# Patient Record
Sex: Male | Born: 1950
Health system: Southern US, Community
[De-identification: ages and names within clinical notes are randomized; demographics above are authoritative.]

## PROBLEM LIST (undated history)

## (undated) DIAGNOSIS — S92309A Fracture of unspecified metatarsal bone(s), unspecified foot, initial encounter for closed fracture: Secondary | ICD-10-CM

## (undated) DIAGNOSIS — E785 Hyperlipidemia, unspecified: Secondary | ICD-10-CM

## (undated) DIAGNOSIS — T7840XA Allergy, unspecified, initial encounter: Secondary | ICD-10-CM

## (undated) DIAGNOSIS — K529 Noninfective gastroenteritis and colitis, unspecified: Secondary | ICD-10-CM

## (undated) DIAGNOSIS — L309 Dermatitis, unspecified: Secondary | ICD-10-CM

## (undated) DIAGNOSIS — M1990 Unspecified osteoarthritis, unspecified site: Secondary | ICD-10-CM

## (undated) DIAGNOSIS — K5790 Diverticulosis of intestine, part unspecified, without perforation or abscess without bleeding: Secondary | ICD-10-CM

## (undated) HISTORY — DX: Diverticulosis of intestine, part unspecified, without perforation or abscess without bleeding: K57.90

## (undated) HISTORY — PX: NOSE SURGERY: SHX723

## (undated) HISTORY — DX: Dermatitis, unspecified: L30.9

## (undated) HISTORY — DX: Unspecified osteoarthritis, unspecified site: M19.90

## (undated) HISTORY — DX: Noninfective gastroenteritis and colitis, unspecified: K52.9

## (undated) HISTORY — PX: NASAL SINUS SURGERY: SHX719

## (undated) HISTORY — DX: Hyperlipidemia, unspecified: E78.5

## (undated) HISTORY — PX: INGUINAL HERNIA REPAIR: SUR1180

## (undated) HISTORY — DX: Allergy, unspecified, initial encounter: T78.40XA

---

## 1988-06-06 ENCOUNTER — Encounter (INDEPENDENT_AMBULATORY_CARE_PROVIDER_SITE_OTHER): Payer: Self-pay | Admitting: *Deleted

## 2000-02-01 ENCOUNTER — Encounter: Admission: RE | Admit: 2000-02-01 | Discharge: 2000-02-01 | Payer: Self-pay | Admitting: Sports Medicine

## 2002-08-07 ENCOUNTER — Encounter: Payer: Self-pay | Admitting: Internal Medicine

## 2002-08-07 HISTORY — PX: COLONOSCOPY: SHX174

## 2004-09-11 ENCOUNTER — Ambulatory Visit: Payer: Self-pay | Admitting: Internal Medicine

## 2004-11-06 ENCOUNTER — Ambulatory Visit: Payer: Self-pay | Admitting: Internal Medicine

## 2004-11-11 ENCOUNTER — Ambulatory Visit: Payer: Self-pay | Admitting: Internal Medicine

## 2004-11-25 ENCOUNTER — Ambulatory Visit: Payer: Self-pay | Admitting: Internal Medicine

## 2005-01-04 ENCOUNTER — Ambulatory Visit: Payer: Self-pay | Admitting: Internal Medicine

## 2005-02-05 ENCOUNTER — Ambulatory Visit: Payer: Self-pay | Admitting: Internal Medicine

## 2005-08-24 ENCOUNTER — Ambulatory Visit: Payer: Self-pay | Admitting: Sports Medicine

## 2006-01-26 ENCOUNTER — Ambulatory Visit: Payer: Self-pay | Admitting: Internal Medicine

## 2006-06-01 ENCOUNTER — Ambulatory Visit (HOSPITAL_COMMUNITY): Admission: RE | Admit: 2006-06-01 | Discharge: 2006-06-01 | Payer: Self-pay | Admitting: Sports Medicine

## 2006-06-01 ENCOUNTER — Encounter (INDEPENDENT_AMBULATORY_CARE_PROVIDER_SITE_OTHER): Payer: Self-pay | Admitting: Sports Medicine

## 2006-07-12 ENCOUNTER — Ambulatory Visit: Payer: Self-pay | Admitting: Internal Medicine

## 2006-07-12 LAB — CONVERTED CEMR LAB
ALT: 23 units/L (ref 0–40)
Albumin: 3.9 g/dL (ref 3.5–5.2)
Alkaline Phosphatase: 52 units/L (ref 39–117)
Cholesterol: 172 mg/dL (ref 0–200)
LDL Cholesterol: 111 mg/dL — ABNORMAL HIGH (ref 0–99)
Total Bilirubin: 2 mg/dL — ABNORMAL HIGH (ref 0.3–1.2)
Total Protein: 6.9 g/dL (ref 6.0–8.3)

## 2006-07-25 ENCOUNTER — Ambulatory Visit: Payer: Self-pay | Admitting: Internal Medicine

## 2006-09-28 ENCOUNTER — Ambulatory Visit: Payer: Self-pay | Admitting: Family Medicine

## 2006-09-28 ENCOUNTER — Telehealth: Payer: Self-pay | Admitting: Internal Medicine

## 2006-10-11 ENCOUNTER — Ambulatory Visit: Payer: Self-pay | Admitting: Internal Medicine

## 2006-10-11 LAB — CONVERTED CEMR LAB
Basophils Absolute: 0 10*3/uL (ref 0.0–0.1)
Eosinophils Absolute: 0.1 10*3/uL (ref 0.0–0.6)
Hemoglobin: 14.9 g/dL (ref 13.0–17.0)
MCHC: 34.3 g/dL (ref 30.0–36.0)
MCV: 90.4 fL (ref 78.0–100.0)
Monocytes Absolute: 0.6 10*3/uL (ref 0.2–0.7)
Monocytes Relative: 11.4 % — ABNORMAL HIGH (ref 3.0–11.0)
RDW: 12 % (ref 11.5–14.6)
T4, Total: 6 ug/dL (ref 5.0–12.5)
TSH: 1.32 microintl units/mL (ref 0.35–5.50)

## 2006-10-12 DIAGNOSIS — J309 Allergic rhinitis, unspecified: Secondary | ICD-10-CM | POA: Insufficient documentation

## 2006-10-12 DIAGNOSIS — G4700 Insomnia, unspecified: Secondary | ICD-10-CM | POA: Insufficient documentation

## 2006-12-18 DIAGNOSIS — J019 Acute sinusitis, unspecified: Secondary | ICD-10-CM | POA: Insufficient documentation

## 2006-12-21 ENCOUNTER — Telehealth: Payer: Self-pay | Admitting: Internal Medicine

## 2006-12-22 ENCOUNTER — Ambulatory Visit: Payer: Self-pay | Admitting: Internal Medicine

## 2007-01-17 ENCOUNTER — Telehealth (INDEPENDENT_AMBULATORY_CARE_PROVIDER_SITE_OTHER): Payer: Self-pay | Admitting: *Deleted

## 2007-01-20 ENCOUNTER — Telehealth (INDEPENDENT_AMBULATORY_CARE_PROVIDER_SITE_OTHER): Payer: Self-pay | Admitting: *Deleted

## 2007-01-31 ENCOUNTER — Ambulatory Visit: Payer: Self-pay | Admitting: Internal Medicine

## 2007-01-31 LAB — CONVERTED CEMR LAB
ALT: 25 units/L (ref 0–53)
AST: 20 units/L (ref 0–37)
Basophils Relative: 0.5 % (ref 0.0–1.0)
Bilirubin, Direct: 0.3 mg/dL (ref 0.0–0.3)
CO2: 31 meq/L (ref 19–32)
Calcium: 9.2 mg/dL (ref 8.4–10.5)
Chloride: 101 meq/L (ref 96–112)
Creatinine, Ser: 1 mg/dL (ref 0.4–1.5)
Eosinophils Relative: 5.6 % — ABNORMAL HIGH (ref 0.0–5.0)
GFR calc non Af Amer: 82 mL/min
Glucose, Bld: 78 mg/dL (ref 70–99)
HCT: 43.4 % (ref 39.0–52.0)
LDL Cholesterol: 115 mg/dL — ABNORMAL HIGH (ref 0–99)
MCV: 91.6 fL (ref 78.0–100.0)
Neutrophils Relative %: 46.7 % (ref 43.0–77.0)
PSA: 0.68 ng/mL (ref 0.10–4.00)
Platelets: 223 10*3/uL (ref 150–400)
RBC: 4.74 M/uL (ref 4.22–5.81)
RDW: 12.3 % (ref 11.5–14.6)
Sodium: 139 meq/L (ref 135–145)
Total Bilirubin: 2.2 mg/dL — ABNORMAL HIGH (ref 0.3–1.2)
Total CHOL/HDL Ratio: 3.6
Triglycerides: 67 mg/dL (ref 0–149)
VLDL: 13 mg/dL (ref 0–40)
WBC: 5.2 10*3/uL (ref 4.5–10.5)

## 2007-02-08 ENCOUNTER — Ambulatory Visit: Payer: Self-pay | Admitting: Internal Medicine

## 2007-02-08 DIAGNOSIS — J329 Chronic sinusitis, unspecified: Secondary | ICD-10-CM | POA: Insufficient documentation

## 2007-02-08 DIAGNOSIS — E785 Hyperlipidemia, unspecified: Secondary | ICD-10-CM | POA: Insufficient documentation

## 2007-05-09 ENCOUNTER — Ambulatory Visit: Payer: Self-pay | Admitting: Sports Medicine

## 2007-05-09 DIAGNOSIS — M25529 Pain in unspecified elbow: Secondary | ICD-10-CM | POA: Insufficient documentation

## 2007-05-09 DIAGNOSIS — S838X9A Sprain of other specified parts of unspecified knee, initial encounter: Secondary | ICD-10-CM | POA: Insufficient documentation

## 2007-05-09 DIAGNOSIS — Q667 Congenital pes cavus, unspecified foot: Secondary | ICD-10-CM | POA: Insufficient documentation

## 2007-05-09 DIAGNOSIS — S86819A Strain of other muscle(s) and tendon(s) at lower leg level, unspecified leg, initial encounter: Secondary | ICD-10-CM

## 2007-05-15 ENCOUNTER — Telehealth: Payer: Self-pay | Admitting: Internal Medicine

## 2007-05-18 ENCOUNTER — Telehealth: Payer: Self-pay | Admitting: Internal Medicine

## 2007-05-30 ENCOUNTER — Ambulatory Visit: Payer: Self-pay | Admitting: Sports Medicine

## 2007-05-30 DIAGNOSIS — M216X9 Other acquired deformities of unspecified foot: Secondary | ICD-10-CM | POA: Insufficient documentation

## 2007-06-09 ENCOUNTER — Ambulatory Visit: Payer: Self-pay | Admitting: Internal Medicine

## 2007-09-01 ENCOUNTER — Telehealth (INDEPENDENT_AMBULATORY_CARE_PROVIDER_SITE_OTHER): Payer: Self-pay | Admitting: *Deleted

## 2007-09-26 ENCOUNTER — Telehealth: Payer: Self-pay | Admitting: Internal Medicine

## 2007-09-26 ENCOUNTER — Ambulatory Visit: Payer: Self-pay | Admitting: Internal Medicine

## 2007-09-26 DIAGNOSIS — N410 Acute prostatitis: Secondary | ICD-10-CM | POA: Insufficient documentation

## 2007-09-26 DIAGNOSIS — R361 Hematospermia: Secondary | ICD-10-CM | POA: Insufficient documentation

## 2007-10-13 ENCOUNTER — Telehealth: Payer: Self-pay | Admitting: *Deleted

## 2007-12-06 ENCOUNTER — Telehealth (INDEPENDENT_AMBULATORY_CARE_PROVIDER_SITE_OTHER): Payer: Self-pay | Admitting: *Deleted

## 2007-12-06 ENCOUNTER — Telehealth: Payer: Self-pay | Admitting: Internal Medicine

## 2008-01-09 ENCOUNTER — Telehealth: Payer: Self-pay | Admitting: Internal Medicine

## 2008-03-21 ENCOUNTER — Ambulatory Visit: Payer: Self-pay | Admitting: Family Medicine

## 2008-03-21 DIAGNOSIS — B070 Plantar wart: Secondary | ICD-10-CM | POA: Insufficient documentation

## 2008-05-01 ENCOUNTER — Telehealth: Payer: Self-pay | Admitting: Internal Medicine

## 2008-05-08 ENCOUNTER — Ambulatory Visit: Payer: Self-pay | Admitting: Internal Medicine

## 2008-05-08 DIAGNOSIS — M542 Cervicalgia: Secondary | ICD-10-CM | POA: Insufficient documentation

## 2008-05-09 ENCOUNTER — Ambulatory Visit: Payer: Self-pay | Admitting: Internal Medicine

## 2008-05-13 ENCOUNTER — Telehealth: Payer: Self-pay | Admitting: Internal Medicine

## 2008-05-16 ENCOUNTER — Telehealth: Payer: Self-pay | Admitting: Internal Medicine

## 2008-08-14 ENCOUNTER — Encounter: Payer: Self-pay | Admitting: Internal Medicine

## 2008-09-02 ENCOUNTER — Telehealth (INDEPENDENT_AMBULATORY_CARE_PROVIDER_SITE_OTHER): Payer: Self-pay | Admitting: *Deleted

## 2008-09-06 ENCOUNTER — Ambulatory Visit: Payer: Self-pay | Admitting: Internal Medicine

## 2008-11-26 ENCOUNTER — Ambulatory Visit: Payer: Self-pay | Admitting: Sports Medicine

## 2008-11-26 DIAGNOSIS — M79609 Pain in unspecified limb: Secondary | ICD-10-CM | POA: Insufficient documentation

## 2008-11-26 DIAGNOSIS — M479 Spondylosis, unspecified: Secondary | ICD-10-CM | POA: Insufficient documentation

## 2009-01-31 ENCOUNTER — Ambulatory Visit: Payer: Self-pay | Admitting: Family Medicine

## 2009-01-31 DIAGNOSIS — J209 Acute bronchitis, unspecified: Secondary | ICD-10-CM | POA: Insufficient documentation

## 2009-03-17 ENCOUNTER — Ambulatory Visit: Payer: Self-pay | Admitting: Internal Medicine

## 2009-03-24 ENCOUNTER — Ambulatory Visit: Payer: Self-pay | Admitting: Internal Medicine

## 2009-05-15 ENCOUNTER — Ambulatory Visit: Payer: Self-pay | Admitting: Sports Medicine

## 2009-05-15 DIAGNOSIS — M542 Cervicalgia: Secondary | ICD-10-CM | POA: Insufficient documentation

## 2009-08-07 ENCOUNTER — Ambulatory Visit: Payer: Self-pay | Admitting: Sports Medicine

## 2009-08-07 DIAGNOSIS — M25519 Pain in unspecified shoulder: Secondary | ICD-10-CM | POA: Insufficient documentation

## 2009-08-15 ENCOUNTER — Encounter: Payer: Self-pay | Admitting: Sports Medicine

## 2009-08-28 ENCOUNTER — Ambulatory Visit: Payer: Self-pay | Admitting: Sports Medicine

## 2009-08-29 ENCOUNTER — Encounter: Payer: Self-pay | Admitting: Sports Medicine

## 2009-10-27 ENCOUNTER — Encounter: Payer: Self-pay | Admitting: Internal Medicine

## 2009-12-02 ENCOUNTER — Ambulatory Visit: Payer: Self-pay | Admitting: Sports Medicine

## 2009-12-10 ENCOUNTER — Ambulatory Visit: Payer: Self-pay | Admitting: Internal Medicine

## 2009-12-10 DIAGNOSIS — K5289 Other specified noninfective gastroenteritis and colitis: Secondary | ICD-10-CM | POA: Insufficient documentation

## 2009-12-10 DIAGNOSIS — R197 Diarrhea, unspecified: Secondary | ICD-10-CM | POA: Insufficient documentation

## 2009-12-17 ENCOUNTER — Encounter: Payer: Self-pay | Admitting: Sports Medicine

## 2009-12-23 ENCOUNTER — Encounter: Payer: Self-pay | Admitting: Sports Medicine

## 2010-02-16 ENCOUNTER — Ambulatory Visit: Payer: Self-pay | Admitting: Internal Medicine

## 2010-04-01 ENCOUNTER — Ambulatory Visit: Admit: 2010-04-01 | Payer: Self-pay | Admitting: Internal Medicine

## 2010-04-02 ENCOUNTER — Encounter: Payer: Self-pay | Admitting: Internal Medicine

## 2010-04-29 ENCOUNTER — Ambulatory Visit: Payer: Self-pay | Admitting: Internal Medicine

## 2010-04-29 ENCOUNTER — Ambulatory Visit: Admit: 2010-04-29 | Payer: Self-pay | Admitting: Internal Medicine

## 2010-04-30 NOTE — Procedures (Signed)
Summary: [facility]   Imported By: Edmonia James 02/23/2010 12:09:56  _____________________________________________________________________  External Attachment:    Type:   Image     Comment:   External Document

## 2010-04-30 NOTE — Consult Note (Signed)
Summary: [employer]  [employer].   Imported By: Tobin Chad 08/15/2009 09:02:45  _____________________________________________________________________  External Attachment:    Type:   Image     Comment:   External Document

## 2010-04-30 NOTE — Letter (Signed)
Summary: New Patient letter  [facility]  [address] [place], [place] [address]   Phone: [phone]  Fax: [phone]       10/27/2009 MRN: [id number]  [place], [place]  [address]  Dear [place],  Welcome to the Gastroenterology Division at [facility].    You are scheduled to see Dr.  Henrene Pastor on 12-10-09 at 10am on the 3rd floor at [facility], [place] [employer].  We ask that you try to arrive at our office 15 minutes prior to your appointment time to allow for check-in.  We would like you to complete the enclosed self-administered evaluation form prior to your visit and bring it with you on the day of your appointment.  We will review it with you.  Also, please bring a complete list of all your medications or, if you prefer, bring the medication bottles and we will list them.  Please bring your insurance card so that we may make a copy of it.  If your insurance requires a referral to see a specialist, please bring your referral form from your primary care physician.  Co-payments are due at the time of your visit and may be paid by cash, check or credit card.     Your office visit will consist of a consult with your physician (includes a physical exam), any laboratory testing he/she may order, scheduling of any necessary diagnostic testing (e.g. x-ray, ultrasound, CT-scan), and scheduling of a procedure (e.g. Endoscopy, Colonoscopy) if required.  Please allow enough time on your schedule to allow for any/all of these possibilities.    If you cannot keep your appointment, please call [phone] to cancel or reschedule prior to your appointment date.  This allows [place] the opportunity to schedule an appointment for another patient in need of care.  If you do not cancel or reschedule by 5 p.m. the business day prior to your appointment date, you will be charged a $50.00 late cancellation/no-show fee.    Thank you for choosing [facility] for your medical needs.  We appreciate the opportunity to care for you.  Please visit [place] at our website  to learn more about our practice.                     Sincerely,                                                             The Gastroenterology Division

## 2010-04-30 NOTE — Assessment & Plan Note (Signed)
Summary: FOLLOW UP - lymphocytic colitis   History of Present Illness Visit Type: Follow-up Visit Primary GI MD: Scarlette Shorts MD Primary Shakeel Disney: Benay Pillow, MD Requesting Tyus Kallam: na Chief Complaint: Lymphocytic Colitis-Pt is doing better.  He is still having diarrhea, but he is only having 6-8 bowel movements per day.  BM's are a mix of loose-watery stool and formed stool.  Pt is still haivng bloating, but not to the degree from 6 weeks ago. History of Present Illness:   60 year old white male with hyperlipidemia and biopsy-proven lymphocytic colitis. He was last seen December 10, 2009. At that time budesonide 9 mg daily was initiated. He returns for followup. She tells me that he has had significant improvement in his bowel habits. He continues to use the bathroom up to 8 times daily. However, his stools are now formed. No, pain or urgency. No appreciable medication side effects. He is pleased. He would prefer even greater improvement as manifested by less frequent bowels.   GI Review of Systems    Reports bloating.      Denies abdominal pain, acid reflux, belching, chest pain, dysphagia with liquids, dysphagia with solids, heartburn, loss of appetite, nausea, vomiting, vomiting blood, weight loss, and  weight gain.      Reports diarrhea.     Denies anal fissure, black tarry stools, change in bowel habit, constipation, diverticulosis, fecal incontinence, heme positive stool, hemorrhoids, irritable bowel syndrome, jaundice, light color stool, liver problems, rectal bleeding, and  rectal pain. Preventive Screening-Counseling & Management  Caffeine-Diet-Exercise     Does Patient Exercise: yes    Current Medications (verified): 1)  Lipitor 10 Mg Tabs (Atorvastatin Calcium) .Marland Kitchen.. 1 By Mouth Daily 2)  Flonase 50 Mcg/act Susp (Fluticasone Propionate) .... 2 Spary Each Nostril Qd 3)  Ketoprofen Gel 20% .... Use Qid As Directed 4)  Budesonide 3 Mg Xr24h-Cap (Budesonide) .... Take 3 By Mouth  Once Daily  Allergies (verified): No Known Drug Allergies  Past History:  Past Medical History: Reviewed history from 12/10/2009 and no changes required. Allergic rhinitis Hyperlipidemia Diverticulosis GERD Arthritis  Past Surgical History: Reviewed history from 12/10/2009 and no changes required. Colonoscopy-08/07/2002 Hernia Surgery  Social History: Occupation: Psychologist Married Childern Patient has never smoked.  Alcohol Use - yes: Occ Daily Caffeine Use: 1 daily  Illicit Drug Use - no Patient gets regular exercise. 6 days/week  Review of Systems       The patient complains of allergy/sinus, back pain, hearing problems, sleeping problems, and thirst - excessive.  The patient denies anemia, anxiety-new, arthritis/joint pain, blood in urine, breast changes/lumps, confusion, cough, coughing up blood, depression-new, fainting, fatigue, fever, headaches-new, heart murmur, heart rhythm changes, itching, muscle pains/cramps, night sweats, nosebleeds, shortness of breath, skin rash, sore throat, swelling of feet/legs, swollen lymph glands, urination - excessive, urination changes/pain, urine leakage, vision changes, and voice change.    Vital Signs:  Patient profile:   60 year old male Height:      73 inches Weight:      154 pounds BMI:     20.39 Pulse rate:   72 / minute Pulse rhythm:   regular BP sitting:   140 / 76  (left arm) Cuff size:   regular  Vitals Entered By: Abelino Derrick CMA Deborra Medina) (February 16, 2010 1:59 PM)  Physical Exam  General:  Well developed, well nourished, no acute distress. Head:  Normocephalic and atraumatic. Eyes:  PERRLA, no icterus. Lungs:  Clear throughout to auscultation. Heart:  Regular rate and rhythm;  no murmurs, rubs,  or bruits. Abdomen:  Soft, nontender and nondistended. No masses, hepatosplenomegaly or hernias noted. Normal bowel sounds. Pulses:  Normal pulses noted. Extremities:  no edema Neurologic:  alert and  oriented Skin:  no jaundice Psych:  Alert and cooperative. Normal mood and affect.   Impression & Recommendations:  Problem # 1:  COLITIS (ICD-558.9) lymphocytic colitis. Improved, though incompletely, on budesonide  Plan: #1. Continue budesonide 9 mg daily #2. Prescribed Lomotil p.r.n. #3. Routine office followup in 6 weeks  Patient Instructions: 1)  Lomotil #60 x 2 RFs. printed and faxed to pharmacy 2)  Please schedule a follow-up appointment in 6 weeks.  3)  Copy sent to : Benay Pillow, MD 4)  The medication list was reviewed and reconciled.  All changed / newly prescribed medications were explained.  A complete medication list was provided to the patient / caregiver. Prescriptions: LOMOTIL 2.5-0.025 MG TABS (DIPHENOXYLATE-ATROPINE) 1 by mouth every 4-6 hours as needed diarrhea  #60 x 2   Entered by:   Randye Lobo NCMA   Authorized by:   Irene Shipper MD   Signed by:   Randye Lobo NCMA on 02/16/2010   Method used:   Printed then [facility]ed [facility] ...       [facility]  [address] [phone]       [address]       [place], [place]  [address]       Ph: [phone] or [phone]       Fax: [phone]   RxID:   [id number]

## 2010-04-30 NOTE — Letter (Signed)
Summary: No Show/Missed Appointment  [facility]  [place], [place] [address]   Phone: [phone]  Fax: [phone]    04/02/2010  [place]  [place], [place]  [address]   Dear [name]:  We had an appointment reserved for you Wednesday, 04/01/10, and we were sorry not to see you.  Since the doctor felt it was important to see you, please call our office as soon as it is convenient so we may reschedule your appointment.  Sincerely,   [facility]

## 2010-04-30 NOTE — Assessment & Plan Note (Signed)
Summary: DIARRHEA - history lymphocytic colitis    History of Present Illness Visit Type: new patient  Primary GI MD: Scarlette Shorts MD Primary Provider: Ricard Dillon, MD  Requesting Provider: na Chief Complaint: lower abd pain, bloating, diarrhea, and diverticulosis History of Present Illness:   60 year old white male with hyperlipidemia and biopsy-proven lymphocytic colitis. Since there regarding chronic problems with diarrhea and wishes to discuss medical therapies. He developed problems with diarrhea around 2001. He underwent colonoscopy in 2004 for routine cancer screening. At that time he reported chronic diarrhea. Colonoscopy revealed sigmoid diverticulosis. No neoplasia. Random colon biopsies confirm lymphocytic colitis. Serologic testing for celiac disease was negative. This is preference in 2004 and again in 2006 to use Imodium for his diarrhea. We discussed other therapies. He was given a prescription for Lomotil and Questran which he did not fill. We also discussed budesonide. He continued with daily diarrhea. Proximal to 6 loose bowel movements per day. Never has a day without loose bowels. Patient awoken to defecate. Interestingly, does report that his bowels improved when he takes a course of steroids for sinus and allergies. No bleeding or other issues. He interested in some medical therapy that might treat the condition rather than the symptom.   GI Review of Systems    Reports abdominal pain and  bloating.     Location of  Abdominal pain: lower abdominal cramping relieved with defecation.    Denies acid reflux, belching, chest pain, dysphagia with liquids, dysphagia with solids, heartburn, loss of appetite, nausea, vomiting, vomiting blood, weight loss, and  weight gain.      Reports diarrhea and  diverticulosis.     Denies anal fissure, black tarry stools, change in bowel habit, constipation, fecal incontinence, heme positive stool, hemorrhoids, irritable bowel syndrome,  jaundice, light color stool, liver problems, rectal bleeding, and  rectal pain.    Current Medications (verified): 1)  Lipitor 10 Mg Tabs (Atorvastatin Calcium) .Marland Kitchen.. 1 By Mouth Daily 2)  Nasonex 50 Mcg/act  Susp (Mometasone Furoate) .... Two Spray Q Nare Bid 3)  Gabapentin 300 Mg Caps (Gabapentin) .Marland Kitchen.. 1 By Mouth Tid 4)  Ketoprofen Gel 20% .... Use Qid As Directed 5)  Anti-Diarrheal 2 Mg Tabs (Loperamide Hcl) .... 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  Alcohol Use - yes: Occ Daily Caffeine Use: 1 daily  Illicit Drug Use - no  Review of Systems       The patient complains of allergy/sinus, arthritis/joint pain, back pain, hearing problems, and sleeping problems.  The patient denies anemia, anxiety-new, blood in urine, breast changes/lumps, change in vision, confusion, cough, coughing up blood, depression-new, fainting, fatigue, fever, headaches-new, heart murmur, heart rhythm changes, itching, muscle pains/cramps, night sweats, nosebleeds, shortness of breath, skin rash, sore throat, swelling of feet/legs, swollen lymph glands, thirst - excessive, urination - excessive, urination changes/pain, urine leakage, vision changes, and voice change.    Vital Signs:  Patient profile:   60 year old male Height:      73 inches Weight:      154 pounds BMI:     20.39 BSA:     1.93 Pulse rate:  60 / minute Pulse rhythm:   regular BP sitting:   110 / 64  (left arm) Cuff size:   regular  Vitals Entered By: Hope Pigeon CMA (December 10, 2009 10:29 AM)  Physical  Exam  General:  Well developed, well nourished, no acute distress. Head:  Normocephalic and atraumatic. Eyes:  PERRLA, no icterus. Ears:  Normal auditory acuity. Nose:  No deformity, discharge,  or lesions. Mouth:  No deformity or lesions, dentition normal. Neck:  Supple; no masses or thyromegaly. Lungs:  Clear throughout to auscultation. Heart:  Regular rate and rhythm; no murmurs, rubs,  or bruits. Abdomen:  Soft, nontender and nondistended. No masses, hepatosplenomegaly or hernias noted. Normal bowel sounds. Msk:  Symmetrical with no gross deformities. Normal posture. Pulses:  Normal pulses noted. Extremities:  No clubbing, cyanosis, edema or deformities noted. Neurologic:  Alert and  oriented x4;  grossly normal neurologically. Skin:  Intact without significant lesions or rashes. Psych:  Alert and cooperative. Normal mood and affect.   Impression & Recommendations:  Problem # 1:  COLITIS (ICD-558.9) Chronic diarrhea 10 years duration. Biopsy-proven lymphocytic colitis in 2004. Managed to this point with p.r.n. Imodium. Significant improvement the patient takes short courses of prednisone for other reasons. Relapse off prednisone. We discussed other medical therapies including budesonide. He is interested in a trial of budesonide therapy.  Plan: #1. Budesonide 9 mg daily #2. Antidiarrheals p.r.n. May be able to wean off if budesonide therapy effective #3. Routine office followup in about 6 weeks  Problem # 2:  SCREENING COLORECTAL-CANCER (ICD-V76.51) negative for neoplasia and 2004. Due for routine followup around 2014  Patient Instructions: 1)  Budesonide 3 mg 3 by mouth once daily #90 x 11 RFs. 2)  Please schedule a follow-up appointment in 6 weeks.  3)  Copy sent to : Ricard Dillon, MD  4)  The medication list was reviewed and reconciled.  All changed / newly prescribed medications were explained.  A complete medication list was provided to the patient /  caregiver. Prescriptions: BUDESONIDE 3 MG XR24H-CAP (BUDESONIDE) take 3 by mouth once daily  #90 x 11   Entered by:   Randye Lobo NCMA   Authorized by:   Irene Shipper MD   Signed by:   Randye Lobo NCMA on 12/10/2009   Method used:   [facility]        [facility]  [address] [phone][facility] (retail)       [place], [place]  [address]       Ph: [phone] or [phone]       Fax: [phone]   RxID:   [id number]   Appended Document: change pharmacy    Clinical Lists Changes  Medications: Rx of BUDESONIDE 3 MG XR24H-CAP (BUDESONIDE) take 3 by mouth once daily;  #90 x 11;  Signed;  Entered by: Randye Lobo NCMA;  Authorized by: Irene Shipper MD;  Method used: Electronically to [facility] [place], [place], [place]  [address], Ph: [phone], Fax: [phone]    Prescriptions: BUDESONIDE 3 MG XR24H-CAP (BUDESONIDE) take 3 by mouth once daily  #90 x 11   Entered by:   Randye Lobo NCMA   Authorized by:   Irene Shipper MD   Signed by:   Randye Lobo NCMA on 12/15/2009   Method used:   Electronically to        [facility]* (  retail)       [address], [place]  [address]       Ph: [phone]       Fax: [phone]   RxID:   [id number]

## 2010-04-30 NOTE — Miscellaneous (Signed)
Summary: [facility]  [facility]   Imported By: Tobin Chad 09/03/2009 10:48:21  _____________________________________________________________________  External Attachment:    Type:   Image     Comment:   External Document

## 2010-04-30 NOTE — Assessment & Plan Note (Signed)
Summary: 2:15-F/U,MC   Vital Signs:  Patient profile:   60 year old male BP sitting:   143 / 88  Vitals Entered By: April Manson CMA (August 28, 2009 2:21 PM)  History of Present Illness: Pt presents with new onset of right shoulder pain that has been getting worse for the past 2 weeks. 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 He has the pain directly on top of his right AC joint.The pain is intermittent. Mildy hurts if he sleeps on his right shoulder but does not wake him up from sleep. Currently taking 369m of gabapentin QHS for his neck.  He denies any injuries. Worse with doing freestyle, followed by backstroke and then breast stroke. He told his therapist who did iontophoresis on his shoulder which made it feel better. He was able to return to swimming the following day without pain for the first 600 yards. However, he developed pain but continued swimming. He has developed enough pain that he has not gone swimming since 08/22/2009.   Allergies: No Known Drug Allergies  Physical Exam  General:  alert and well-developed.   Head:  normocephalic and atraumatic.   Ears:  Normal hearing Mouth:  MMM Neck:  supple and full ROM.  No TTP along cervical spine. Lungs:  normal respiratory effort.   Msk:  Right Shoulder: Prominant AC joint which is similar to left AC joint No bruising or edema Full ROM with some pain at 90 degrees with flexion and abduction + TTP over ACraig Hospitaljoint 5/5 strength with resisted internal and external rotation, biceps, triceps and deltoid testing Neg Neer's, Speed's, Obrien's, Yergesson's Mildly positive Hawkin's, + Cross over Neurovascularly intact  Left Shoulder: More prominant scapula  Prominant AC joint Full ROM without pain 5/5 strength with resisted IR, ER, triceps, biceps and deltoid testing Neg special testing No  TTP throughout Neurovascularly intact  Additional Exam:  MSK UKoreathere is inc fluid over both AC joints no sing of DJD but some increased space noted  RC tendons are all intact bursa shows mild swelling superiorly  no impingement on dynamic motion  images saved   Impression & Recommendations:  Problem # 1:  SHOULDER PAIN, RIGHT (ICD-719.41)  This is new and possibly from compensation while working on left  mild bursitiis noted  see instructions and will send for PT  Orders: [place] (([id number]  Problem # 2:  CERVICAL RADICULOPATHY (ICD-723.4) still w winging on left  somewhat less keep working w PT  Complete Medication List: 1)  Lipitor 10 Mg Tabs (Atorvastatin calcium) ..Marland Kitchen. 1 by mouth daily 2)  Nasonex 50 Mcg/act Susp (Mometasone furoate) .... Two spray q nare bid 3)  Ambien 10 Mg Tabs (Zolpidem tartrate) .... One by mouth as needed sleep 4)  Gabapentin 300 Mg Caps (Gabapentin) ..Marland Kitchen. 1 by mouth tid  Patient Instructions: 1)  On UKoreascan there is mild to moderate fluid in subdeltoid bursa on RT 2)  Both AC joints are lax with increase fluid build up 3)  no rotator cuff tears 4)  please continue with PT - twice per week ideally next 6 weeks or so 5)  Iontophoresis may be helpful for Rt shoulder with dexamethasone..Marland Kitchen6)  I would like to reck both shoulders when JJenny Reichmannand You feel that you have made pretty good progress 7)  ice shoulder for 10  minutes once or twice daily over top 8)  for next week only easy swimming at most but get opinion at PT tomorrow to see if shoulder is progressing OK 9)  If so for next week keep swimming to easy workouts for at least 7 to 10 days / longer if symptoms persist  Appended Document: 2:15-F/U,MC Also given a prescription for iontophoresis at PT

## 2010-04-30 NOTE — Procedures (Signed)
Summary: Colonoscopy/[facility]  Colonoscopy/[facility]   Imported By: Edmonia James 02/23/2010 12:07:25  _____________________________________________________________________  External Attachment:    Type:   Image     Comment:   External Document

## 2010-04-30 NOTE — Assessment & Plan Note (Signed)
Summary: NECK/BACK/SHOULDER PAIN,MC   Vital Signs:  Patient profile:   60 year old male Height:      73 inches Weight:      155 pounds BP sitting:   128 / 80  Vitals Entered By: April Manson CMA (May 15, 2009 11:00 AM)  History of Present Illness: January first noticed pain on airplane in upper periscapular area noticed pain. Went to family practice dr in february and had xrays taken of cervical spine.  These showed significant DDD and OA. PT by Katrine Coho really helped and he stopped in may after improvement.   [place] and hurt back in april. Since then the neck is more troublesome Upper back and neck pain with swimming now geting weak with breast stroke and with butterfly these also send pain to C6 to T2 area post spine   Saw him in Aug and was given exercises. PNF to right side of neck was painful-could not do.  He did scap stabilization exercises and gained strength however with travel, more sxs he feels he is now getting worse  Allergies: No Known Drug Allergies  Physical Exam  General:  Well-developed,well-nourished,in no acute distress; alert,appropriate and cooperative throughout examination Neck:  some limitation of full motion noted on ext, flex limited left lat bend and that casues some pain left rotation < RT  no Trap spasm today Msk:  repetitive abduction exercises causes winging of left scapula after only 4 repeats he also has lat migration and IR noted  there is focal area of atrophy in upper medial scapular border on left  shoulder exam is otherwise unremarkable   Impression & Recommendations:  Problem # 1:  NECK PAIN, ACUTE (ICD-723.1) while a lot of the acute pain has resolved he now has some chronic neck pain particularly at night or with certain swim strokes  Problem # 2:  DEGENERATIVE JOINT DISEASE, 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 1 by mouth daily 2)  Nasonex 50 Mcg/act Susp (Mometasone furoate) .... Two spray q nare bid 3)  Ambien 10 Mg Tabs (Zolpidem tartrate) .... One by mouth as needed sleep 4)  Gabapentin 300 Mg Caps (Gabapentin) .Marland Kitchen.. 1 by mouth tid  Patient Instructions: 1)  use 1 gabapentin at night 2)  If no change in 2 weeks go to 2 at night 3)  try PT again with Jenny Reichmann 4)  recommend get onto a home program once this is complete 5)  still would do 3 to 6 weeks of therapy to get this left periscapular area functioning better 6)  reck 6 weeks Prescriptions: GABAPENTIN 300 MG CAPS (GABAPENTIN) 1 by mouth tid  #90 x 2   Entered and Authorized by:   Stefanie Libel MD   Signed by:   Stefanie Libel MD on 05/15/2009   Method used:   Electronically to        [facility] (retail)       [address]       [place], [place]  [address]       Ph: [phone]       Fax: [phone]   RxID:   [id number]

## 2010-04-30 NOTE — Progress Notes (Signed)
Summary: [facility]   Imported By: Edmonia James 02/23/2010 12:05:56  _____________________________________________________________________  External Attachment:    Type:   Image     Comment:   External Document

## 2010-04-30 NOTE — Assessment & Plan Note (Signed)
Summary: f/u,mc   Vital Signs:  Patient profile:   60 year old male BP sitting:   125 / 8  Vitals Entered By: April Manson CMA (December 02, 2009 8:56 AM)  History of Present Illness: Very limited in ability to swim max has been 2000 M usually swims 400 to 800 M 2/2 pain RT side pain is over [facility] joint area  left feels weak  PT felt he had gained strength in areas we rehabbed but not a change in [facility] joint  Allergies: No Known Drug Allergies  Physical Exam  General:  Well-developed,well-nourished,in no acute distress; alert,appropriate and cooperative throughout examination Msk:  neg signs for impingement RT AC joint sits high and stillis painful on xover type moves good strength thru out  scapular motion is now symmetrical localized area of atrophy on left periscapular area is noted does not show protraction of scapula on RT w repetitive movement as before   Impression & Recommendations:  Problem # 1:  SHOULDER PAIN, RIGHT (ICD-719.41) Assessment Unchanged This is opnly marginally improved and seems to be chron ic AC  will add ketorprofen gel and see if this helps sxs  swim on every other day basis and limit yards  keep up HEP  Problem # 2:  SHOULDER PAIN, LEFT (ICD-719.41) Assessment: Improved this is resoved  Problem # 3:  CERVICAL RADICULOPATHY (ICD-723.4) rare sxs  can try weaining neurontin by goin every other day if desired  will reck prn  Complete Medication List: 1)  Lipitor 10 Mg Tabs (Atorvastatin calcium) .Marland Kitchen.. 1 by mouth daily 2)  Nasonex 50 Mcg/act Susp (Mometasone furoate) .... Two spray q nare bid 3)  Ambien 10 Mg Tabs (Zolpidem tartrate) .... One by mouth as needed sleep 4)  Gabapentin 300 Mg Caps (Gabapentin) .Marland Kitchen.. 1 by mouth tid 5)  Ketoprofen Gel 20%  .... Use qid as directed Prescriptions: KETOPROFEN GEL 2% USE QID AS DIRECTED  #[id number] x PRN   Entered by:   April Manson CMA   Authorized by:   Stefanie Libel MD   Signed by:   April Manson CMA on 12/02/2009   Method used:   Print then Give to Patient   RxID:   [id number]

## 2010-04-30 NOTE — Letter (Signed)
Summary: [facility] PT prescription  [facility] prescription   Imported By: Tobin Chad 08/28/2009 16:48:52  _____________________________________________________________________  External Attachment:    Type:   Image     Comment:   External Document

## 2010-04-30 NOTE — Miscellaneous (Signed)
Summary: [facility]   Imported By: Tobin Chad 12/23/2009 15:19:23  _____________________________________________________________________  External Attachment:    Type:   Image     Comment:   External Document

## 2010-04-30 NOTE — Consult Note (Signed)
Summary: [facility]   Imported By: Tobin Chad 12/25/2009 10:40:35  _____________________________________________________________________  External Attachment:    Type:   Image     Comment:   External Document

## 2010-04-30 NOTE — Assessment & Plan Note (Signed)
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 specific strength testing of Rotator cuff mm reveals good strength throughout; no signs of impingement; speeds and yergason's tests normal;  no labral pathology noted;  left scapula is IR and wings at rest with repeat abduction more winging noted SCAP assist test limits shoulder sxs and improves movement   negative painful arc and no drop arm sign.  XOVER test on RT causes slt AC joint pain    Impression & Recommendations:  Problem # 1:  SHOULDER PAIN, LEFT (ICD-719.41) I think he still has weakness of periscapular mm  given scap stab exercises and advised to cont those from PT Back to PT for more work on this  Left seems to be fatiguing faster  reck in 3 mos  Problem # 2:  CERVICAL RADICULOPATHY (ICD-723.4) this is improved in terms of sxs and trap spasm  however, radicular issues prob contribute to left shoulder probs  Complete Medication List: 1)  Lipitor 10 Mg Tabs (Atorvastatin calcium) .Marland Kitchen.. 1 by mouth daily 2)  Nasonex 50 Mcg/act Susp (Mometasone furoate) .... Two spray q nare bid 3)  Ambien 10 Mg Tabs (Zolpidem tartrate) .... One by mouth  as needed sleep 4)  Gabapentin 300 Mg Caps (Gabapentin) .Marland Kitchen.. 1 by mouth tid

## 2010-05-25 ENCOUNTER — Encounter: Payer: Self-pay | Admitting: Internal Medicine

## 2010-05-25 ENCOUNTER — Ambulatory Visit (INDEPENDENT_AMBULATORY_CARE_PROVIDER_SITE_OTHER): Payer: Commercial Managed Care - PPO | Admitting: Internal Medicine

## 2010-05-25 DIAGNOSIS — K5289 Other specified noninfective gastroenteritis and colitis: Secondary | ICD-10-CM

## 2010-05-25 DIAGNOSIS — R197 Diarrhea, unspecified: Secondary | ICD-10-CM

## 2010-06-04 NOTE — Assessment & Plan Note (Signed)
Summary: Lymphocytic colitis    History of Present Illness Visit Type: Follow-up Visit Primary GI MD: Scarlette Shorts MD Primary Provider: Benay Pillow, MD Requesting Provider: na Chief Complaint: lymphocytic colitis  with little improvement History of Present Illness:    a 60 year old with hyperlipidemia and biopsy proven lymphocytic colitis. in September of 2011 he was placed on budesonide 9 mg daily. He was seen in followup in November 2011. At that time , symptoms were improved though incompletely. He was prescribed Lomotil in addition. He has continued on budesonide 9 mg daily and Lomotil once daily (rarely twice daily) in mild December he reported significant improvement. Less so currently. Currently describes 3-4 bowel movements in the morning and 0-3 bowel movements throughout the balance of the day thereafter. Still without significant form so much improvement in urgency issues. No constipation or days without bowel movements. Otherwise stable.   GI Review of Systems      Denies abdominal pain, acid reflux, belching, bloating, chest pain, dysphagia with liquids, dysphagia with solids, heartburn, loss of appetite, nausea, vomiting, vomiting blood, weight loss, and  weight gain.      Reports diarrhea.     Denies anal fissure, black tarry stools, change in bowel habit, constipation, diverticulosis, fecal incontinence, heme positive stool, hemorrhoids, irritable bowel syndrome, jaundice, light color stool, liver problems, rectal bleeding, and  rectal pain.    Current Medications (verified): 1)  Lipitor 10 Mg Tabs (Atorvastatin Calcium) .Marland Kitchen.. 1 By Mouth Daily 2)  Flonase 50 Mcg/act Susp (Fluticasone Propionate) .... 2 Spary Each Nostril Qd 3)  Ketoprofen Gel 20% .... Use Qid As Directed 4)  Budesonide 3 Mg Xr24h-Cap (Budesonide) .... Take 3 By Mouth Once Daily 5)  Lomotil 2.5-0.025 Mg Tabs (Diphenoxylate-Atropine) .Marland Kitchen.. 1 By Mouth Every 4-6 Hours As Needed Diarrhea  Allergies  (verified): No Known Drug Allergies  Past History:  Past Medical History: Reviewed history from 12/10/2009 and no changes required. Allergic rhinitis Hyperlipidemia Diverticulosis GERD Arthritis  Past Surgical History: Colonoscopy-08/07/2002 Hernia Surgery Sinus surgery  Family History: Reviewed history from 12/10/2009 and no changes required. Family History of CAD Male 1st degree relative <50 No FH of Colon Cancer:  Social History: Reviewed history from 02/16/2010 and no changes required. Occupation: Psychologist Married Childern Patient has never smoked.  Alcohol Use - yes: Occ Daily Caffeine Use: 1 daily  Illicit Drug Use - no Patient gets regular exercise. 6 days/week  Review of Systems  The patient denies allergy/sinus, anemia, anxiety-new, arthritis/joint pain, back pain, blood in urine, breast changes/lumps, change in vision, confusion, cough, coughing up blood, depression-new, fainting, fatigue, fever, headaches-new, hearing problems, heart murmur, heart rhythm changes, itching, menstrual pain, muscle pains/cramps, night sweats, nosebleeds, pregnancy symptoms, shortness of breath, skin rash, sleeping problems, sore throat, swelling of feet/legs, swollen lymph glands, thirst - excessive , urination - excessive , urination changes/pain, urine leakage, vision changes, and voice change.    Vital Signs:  Patient profile:   60 year old male Height:      73 inches Weight:      151 pounds BMI:     19.99 Pulse rate:   60 / minute Pulse rhythm:   regular BP sitting:   134 / 80  (left arm) Cuff size:   regular  Vitals Entered By: June McMurray Pratt Deborra Medina) (May 25, 2010 9:56 AM)  Physical Exam  General:  Well developed, well nourished, no acute distress. Head:  Normocephalic and atraumatic. Eyes:  PERRLA, no icterus. Mouth:  No deformity or lesions,  dentition normal. Neck:  Supple; no masses or thyromegaly. Lungs:  Clear throughout to auscultation. Heart:   Regular rate and rhythm; no murmurs, rubs,  or bruits. Abdomen:  Soft, nontender and nondistended. No masses, hepatosplenomegaly or hernias noted. Normal bowel sounds. Msk:   Normal posture. Pulses:  Normal pulses noted. Extremities:   no edema Neurologic:  Alert and  oriented x4. Skin:  Intact without significant lesions or rashes. Psych:  Alert and cooperative. Normal mood and affect.   Impression & Recommendations:  Problem # 1:  COLITIS (ICD-558.9)  lymphocytic colitis. overall, there seems to be objective improvement as well as subjective improvement based on his reporting. however, still symptomatic.     plan  #1. continue budesonide 9 mg daily  #2. more liberal use of Lomotil. They began with one twice daily and titrate to as many as 4 per day if needed. Hold for constipation  #3. routine followup in about 3 months  Problem # 2:  SCREENING COLORECTAL-CANCER (ICD-V76.51)  due for followup around May 2014  Patient Instructions: 1)  Please schedule a follow-up appointment in 3 months. 2)  Copy sent to : Benay Pillow, MD 3)  The medication list was reviewed and reconciled.  All changed / newly prescribed medications were explained.  A complete medication list was provided to the patient / caregiver.

## 2010-06-11 ENCOUNTER — Encounter: Payer: Self-pay | Admitting: *Deleted

## 2010-06-22 ENCOUNTER — Other Ambulatory Visit: Payer: Self-pay | Admitting: Internal Medicine

## 2010-06-22 MED ORDER — DIPHENOXYLATE-ATROPINE 2.5-0.025 MG PO TABS: 1.0000 | ORAL_TABLET | Freq: Four times a day (QID) | ORAL | Status: DC | PRN

## 2010-06-23 ENCOUNTER — Encounter: Payer: Self-pay | Admitting: Internal Medicine

## 2010-06-26 ENCOUNTER — Other Ambulatory Visit: Payer: Self-pay | Admitting: Internal Medicine

## 2010-06-26 MED ORDER — DIPHENOXYLATE-ATROPINE 2.5-0.025 MG PO TABS: 1.0000 | ORAL_TABLET | Freq: Four times a day (QID) | ORAL | Status: DC | PRN

## 2010-06-26 NOTE — Telephone Encounter (Signed)
Rx. Printed and faxed to pharmacy.

## 2010-07-07 ENCOUNTER — Other Ambulatory Visit: Payer: Self-pay

## 2010-07-09 ENCOUNTER — Other Ambulatory Visit (INDEPENDENT_AMBULATORY_CARE_PROVIDER_SITE_OTHER): Payer: Commercial Managed Care - PPO

## 2010-07-09 DIAGNOSIS — Z0000 Encounter for general adult medical examination without abnormal findings: Secondary | ICD-10-CM

## 2010-07-09 LAB — HEPATIC FUNCTION PANEL
AST: 20 U/L (ref 0–37)
Bilirubin, Direct: 0.3 mg/dL (ref 0.0–0.3)
Total Bilirubin: 2.4 mg/dL — ABNORMAL HIGH (ref 0.3–1.2)

## 2010-07-09 LAB — POCT URINALYSIS DIPSTICK
Bilirubin, UA: NEGATIVE
Glucose, UA: NEGATIVE
Ketones, UA: NEGATIVE
Leukocytes, UA: NEGATIVE
Spec Grav, UA: 1.025

## 2010-07-09 LAB — CBC WITH DIFFERENTIAL/PLATELET
Basophils Absolute: 0 10*3/uL (ref 0.0–0.1)
HCT: 43.2 % (ref 39.0–52.0)
Lymphocytes Relative: 39.4 % (ref 12.0–46.0)
Lymphs Abs: 2.6 10*3/uL (ref 0.7–4.0)
Monocytes Relative: 12.7 % — ABNORMAL HIGH (ref 3.0–12.0)
Neutrophils Relative %: 46.7 % (ref 43.0–77.0)
Platelets: 219 10*3/uL (ref 150.0–400.0)
RDW: 12.7 % (ref 11.5–14.6)
WBC: 6.6 10*3/uL (ref 4.5–10.5)

## 2010-07-09 LAB — LIPID PANEL
Cholesterol: 180 mg/dL (ref 0–200)
LDL Cholesterol: 94 mg/dL (ref 0–99)
Total CHOL/HDL Ratio: 2
VLDL: 12 mg/dL (ref 0.0–40.0)

## 2010-07-09 LAB — BASIC METABOLIC PANEL
BUN: 17 mg/dL (ref 6–23)
Calcium: 9.5 mg/dL (ref 8.4–10.5)
Creatinine, Ser: 1 mg/dL (ref 0.4–1.5)
GFR: 85.12 mL/min (ref >60.00)
Glucose, Bld: 85 mg/dL (ref 70–99)

## 2010-07-14 ENCOUNTER — Encounter: Payer: Self-pay | Admitting: Internal Medicine

## 2010-07-21 ENCOUNTER — Other Ambulatory Visit: Payer: Self-pay | Admitting: Internal Medicine

## 2010-07-23 ENCOUNTER — Ambulatory Visit (INDEPENDENT_AMBULATORY_CARE_PROVIDER_SITE_OTHER): Payer: Commercial Managed Care - PPO | Admitting: Internal Medicine

## 2010-07-23 ENCOUNTER — Encounter: Payer: Self-pay | Admitting: Internal Medicine

## 2010-07-23 VITALS — BP 110/76 | HR 68 | Temp 98.5°F | Resp 14 | Ht 73.0 in | Wt 152.0 lb

## 2010-07-23 DIAGNOSIS — Z0000 Encounter for general adult medical examination without abnormal findings: Secondary | ICD-10-CM

## 2010-07-23 DIAGNOSIS — Z23 Encounter for immunization: Secondary | ICD-10-CM

## 2010-07-23 NOTE — Progress Notes (Signed)
Addended by: Carylon Perches on: 07/23/2010 11:05 AM   Modules accepted: Orders

## 2010-07-23 NOTE — Progress Notes (Signed)
  Subjective:    Patient ID: Frank Larson, male    DOB: [birth year], 60 y.o.   MRN: [id number]  HPI presents for a CPX He has no voiced complaints other than his sports injuries which are documented on the record of Dr. fields   Review of Systems  Constitutional: Negative for fever and fatigue.  HENT: Negative for hearing loss, congestion, neck pain and postnasal drip.   Eyes: Negative for discharge, redness and visual disturbance.  Respiratory: Negative for cough, shortness of breath and wheezing.   Cardiovascular: Negative for leg swelling.  Gastrointestinal: Negative for abdominal pain, constipation and abdominal distention.  Genitourinary: Negative for urgency and frequency.  Musculoskeletal: Negative for joint swelling and arthralgias.  Skin: Negative for color change and rash.  Neurological: Negative for weakness and light-headedness.  Hematological: Negative for adenopathy.  Psychiatric/Behavioral: Negative for behavioral problems.       Past Medical History  Diagnosis Date  . Allergy   . Hyperlipidemia   . Diverticulosis   . GERD (gastroesophageal reflux disease)   . Arthritis    Past Surgical History  Procedure Date  . Colonoscopy 08/07/2002  . Hernia repair   . Nasal sinus surgery     reports that he has never smoked. He does not have any smokeless tobacco history on file. He reports that he drinks alcohol. He reports that he does not use illicit drugs. family history is not on file. No Known Allergies  Objective:   Physical Exam  Constitutional: He is oriented to person, place, and time. He appears well-developed and well-nourished.  HENT:  Head: Normocephalic and atraumatic.  Eyes: Conjunctivae are normal. Pupils are equal, round, and reactive to light.  Neck: Normal range of motion. Neck supple.  Cardiovascular: Normal rate and regular rhythm.   Pulmonary/Chest: Effort normal and breath sounds normal.  Abdominal: Soft. Bowel sounds are normal.    Genitourinary: Rectum normal and prostate normal.  Musculoskeletal: Normal range of motion.  Neurological: He is alert and oriented to person, place, and time. He has normal reflexes.  Skin: Skin is warm and dry.  Psychiatric: He has a normal mood and affect. His behavior is normal.       EKG  No changes from prior    Rt BBB   Assessment & Plan:   Patient presents for yearly preventative medicine examination.   all immunizations and health maintenance protocols were reviewed with the patient and they are up to date with these protocols.   screening laboratory values were reviewed with the patient including screening of hyperlipidemia PSA renal function and hepatic function.   There medications past medical history social history problem list and allergies were reviewed in detail.   Goals were established with regard to weight loss exercise diet in compliance with medications EKG

## 2010-08-11 MED ORDER — DIPHENOXYLATE-ATROPINE 2.5-0.025 MG PO TABS: 1.0000 | ORAL_TABLET | Freq: Four times a day (QID) | ORAL | Status: DC | PRN

## 2010-08-11 NOTE — Telephone Encounter (Signed)
Printed and faxed to [facility].

## 2010-11-11 ENCOUNTER — Telehealth: Payer: Self-pay | Admitting: Internal Medicine

## 2010-11-11 MED ORDER — ZOLPIDEM TARTRATE ER 6.25 MG PO TBCR: 12.5000 mg | EXTENDED_RELEASE_TABLET | Freq: Every evening | ORAL | Status: DC | PRN

## 2010-11-11 NOTE — Telephone Encounter (Signed)
Ok per dr Arnoldo Morale to change to generic cr 12.5 ambien and called into pharmacy

## 2010-11-11 NOTE — Telephone Encounter (Signed)
Pt called and has an expired script for generic Ambien 10 mg.  Pt is req to try the generic time released Ambien. Pls call in to [facility].

## 2010-12-01 ENCOUNTER — Telehealth: Payer: Self-pay | Admitting: Internal Medicine

## 2010-12-01 NOTE — Telephone Encounter (Signed)
Pt states that he has been taking entocort and states that it has not been working as well. Pt states he is tired all the time, has a headache and is dizzy. Wonders if he perhaps may be dehydrated. Pt requesting to be seen. Pt scheduled to see Dr. Henrene Pastor 12/02/10@11 :15am. Pt aware of appt date and time.

## 2010-12-02 ENCOUNTER — Encounter: Payer: Self-pay | Admitting: Internal Medicine

## 2010-12-02 ENCOUNTER — Ambulatory Visit (INDEPENDENT_AMBULATORY_CARE_PROVIDER_SITE_OTHER): Payer: Commercial Managed Care - PPO | Admitting: Internal Medicine

## 2010-12-02 VITALS — BP 134/80 | HR 62 | Temp 98.2°F | Ht 73.0 in | Wt 151.0 lb

## 2010-12-02 DIAGNOSIS — K52832 Lymphocytic colitis: Secondary | ICD-10-CM

## 2010-12-02 DIAGNOSIS — R197 Diarrhea, unspecified: Secondary | ICD-10-CM

## 2010-12-02 DIAGNOSIS — K5289 Other specified noninfective gastroenteritis and colitis: Secondary | ICD-10-CM

## 2010-12-02 MED ORDER — DIPHENOXYLATE-ATROPINE 2.5-0.025 MG PO TABS: 1.0000 | ORAL_TABLET | Freq: Four times a day (QID) | ORAL | Status: DC | PRN

## 2010-12-02 MED ORDER — BUDESONIDE 3 MG PO CP24: 9.0000 mg | ORAL_CAPSULE | ORAL | Status: DC

## 2010-12-02 MED ORDER — DIPHENOXYLATE-ATROPINE 2.5-0.025 MG PO TABS: 1.0000 | ORAL_TABLET | Freq: Every day | ORAL | Status: DC

## 2010-12-02 NOTE — Patient Instructions (Signed)
Prescription for Lomotil printed and given to you. Entocort sent to your pharmacy electronically. Labs ordered for you to have done today on basement floor.

## 2010-12-02 NOTE — Progress Notes (Signed)
HISTORY OF PRESENT ILLNESS:  Frank Larson is a 60 y.o. male with hyperlipidemia and biopsy-proven lymphocytic colitis in 2011. In September 2011 he was placed on budesonide 9 mg daily. He was seen in followup November 2011 and again in February 2012 with some improvement, though incomplete, and symptoms. At the time of his last visit, he was to continue on budesonide and more liberal use of Lomotil. Followup in 3 months recommended. However, he follows up at this time. He states that he still having significant problems with diarrhea. He reports 2-3 episodes in the morning, possibly one episode later in the day, and 2 nocturnal episodes which awaken. Stools are not formed. No abdominal pain or bleeding. Weight has been stable. He has been compliant with budesonide. He has been using Lomotil one each morning. No recent antibiotic use.  REVIEW OF SYSTEMS:  All non-GI ROS negative except for headaches and fatigue.  Past Medical History  Diagnosis Date  . Allergy   . Hyperlipidemia   . Diverticulosis   . GERD (gastroesophageal reflux disease)   . Arthritis   . Colitis     Past Surgical History  Procedure Date  . Colonoscopy 08/07/2002  . Hernia repair   . Nasal sinus surgery     Social History Frank Larson  reports that he has never smoked. He does not have any smokeless tobacco history on file. He reports that he drinks alcohol. He reports that he does not use illicit drugs.  family history is negative for Colon cancer.  No Known Allergies     PHYSICAL EXAMINATION: Vital signs: BP 134/80  Pulse 62  Temp(Src) 98.2 F (36.8 C) (Oral)  Ht 6' 1"  (1.854 m)  Wt 151 lb (68.493 kg)  BMI 19.92 kg/m2 General: Well-developed, well-nourished, no acute distress HEENT: Sclerae are anicteric, conjunctiva pink. Oral mucosa intact Lungs: Clear Heart: Regular Abdomen: soft, nontender, nondistended, no obvious ascites, no peritoneal signs, normal bowel sounds. No organomegaly. Extremities: No  edema Psychiatric: alert and oriented x3. Cooperative    ASSESSMENT:  #1. Lymphocytic colitis. Ongoing symptoms despite budesonide 9 mg daily and Lomotil one each morning #2. Diarrhea. Likely due to the same. Rule out superinfection   PLAN:  #1. We discussed other treatment options including prednisone, immunomodulators, and symptomatic therapies. To this end, he will continue on budesonide 9 mg daily. He will increase Lomotil use up to 4 per day. #2. Stool for C. difficile by PCR #3. Refill prescriptions as requested #4. Routine office followup in 4-6 weeks. Contact the office in the interim for any questions or problems.

## 2010-12-04 ENCOUNTER — Ambulatory Visit: Payer: Commercial Managed Care - PPO

## 2010-12-04 DIAGNOSIS — K5289 Other specified noninfective gastroenteritis and colitis: Secondary | ICD-10-CM

## 2010-12-07 ENCOUNTER — Telehealth: Payer: Self-pay

## 2010-12-07 LAB — CLOSTRIDIUM DIFFICILE BY PCR: Toxigenic C. Difficile by PCR: NOT DETECTED

## 2010-12-07 NOTE — Telephone Encounter (Signed)
Message left for pt regarding results per Dr. Henrene Pastor.

## 2010-12-07 NOTE — Telephone Encounter (Signed)
Message copied by Faythe Casa on Mon Dec 07, 2010  3:39 PM ------      Message from: Irene Shipper      Created: Mon Dec 07, 2010  3:34 PM       Please let Dr. Hulen Skains know that his testing for C. difficile was negative. He should Continue with the plan as outlined in the office encounter

## 2010-12-15 ENCOUNTER — Ambulatory Visit: Payer: Commercial Managed Care - PPO | Admitting: Internal Medicine

## 2010-12-28 ENCOUNTER — Encounter: Payer: Self-pay | Admitting: Internal Medicine

## 2010-12-28 ENCOUNTER — Ambulatory Visit (INDEPENDENT_AMBULATORY_CARE_PROVIDER_SITE_OTHER): Payer: Commercial Managed Care - PPO | Admitting: Internal Medicine

## 2010-12-28 VITALS — BP 140/80 | HR 64 | Temp 98.3°F | Resp 16 | Ht 73.0 in | Wt 152.0 lb

## 2010-12-28 DIAGNOSIS — Z23 Encounter for immunization: Secondary | ICD-10-CM

## 2010-12-28 DIAGNOSIS — IMO0001 Reserved for inherently not codable concepts without codable children: Secondary | ICD-10-CM

## 2010-12-28 DIAGNOSIS — Z1283 Encounter for screening for malignant neoplasm of skin: Secondary | ICD-10-CM

## 2010-12-28 DIAGNOSIS — L570 Actinic keratosis: Secondary | ICD-10-CM

## 2010-12-28 DIAGNOSIS — W57XXXA Bitten or stung by nonvenomous insect and other nonvenomous arthropods, initial encounter: Secondary | ICD-10-CM

## 2010-12-28 DIAGNOSIS — R21 Rash and other nonspecific skin eruption: Secondary | ICD-10-CM

## 2010-12-28 DIAGNOSIS — Z20828 Contact with and (suspected) exposure to other viral communicable diseases: Secondary | ICD-10-CM

## 2010-12-28 NOTE — Progress Notes (Signed)
  Subjective:    Patient ID: Frank Larson, male    DOB: 01-26-1951, 60 y.o.   MRN: [id number]  HPI Patient presents today for surveillance for skin cancer Patient also has history of tick bite and exposure to possible rash and myalgia He is concerned about exposure to Lyme disease   Review of Systems  Constitutional: Negative for fever and fatigue.  HENT: Negative for hearing loss, congestion, neck pain and postnasal drip.   Eyes: Negative for discharge, redness and visual disturbance.  Respiratory: Negative for cough, shortness of breath and wheezing.   Cardiovascular: Negative for leg swelling.  Gastrointestinal: Negative for abdominal pain, constipation and abdominal distention.  Genitourinary: Negative for urgency and frequency.  Musculoskeletal: Positive for myalgias. Negative for joint swelling and arthralgias.  Skin: Positive for rash. Negative for color change.  Neurological: Negative for weakness and light-headedness.  Hematological: Negative for adenopathy. Does not bruise/bleed easily.  Psychiatric/Behavioral: Negative for behavioral problems.       Objective:   Physical Exam  Nursing note and vitals reviewed. Constitutional: He appears well-developed and well-nourished.  HENT:  Head: Normocephalic and atraumatic.  Eyes: Conjunctivae are normal. Pupils are equal, round, and reactive to light.  Neck: Normal range of motion. Neck supple.  Cardiovascular: Normal rate and regular rhythm.   Pulmonary/Chest: Effort normal and breath sounds normal.  Abdominal: Soft. Bowel sounds are normal.  Skin:       Actinic keratoses identified on the face          Assessment & Plan:   Informed consent was obtained in the lesion was treated for 60 seconds of liquid nitrogen application the patient tolerated the procedure well as procedural care was discussed with the patient and instructions should the lesion reappears contact our office immediately  A screening examination of  the patient's skin for skin cancer was completed during this office visit.  We discussed with the patient appropriate screening for exposure to Lyme disease and appropriate treatment.

## 2011-01-19 ENCOUNTER — Other Ambulatory Visit: Payer: Self-pay | Admitting: Internal Medicine

## 2011-01-21 ENCOUNTER — Telehealth: Payer: Self-pay

## 2011-01-21 NOTE — Telephone Encounter (Signed)
Left message on machine negative

## 2011-01-21 NOTE — Telephone Encounter (Signed)
Pt called and request lab results from lyme and hep. C.

## 2011-03-12 ENCOUNTER — Other Ambulatory Visit: Payer: Self-pay

## 2011-03-12 ENCOUNTER — Other Ambulatory Visit: Payer: Self-pay | Admitting: Internal Medicine

## 2011-03-12 MED ORDER — DIPHENOXYLATE-ATROPINE 2.5-0.025 MG PO TABS: 1.0000 | ORAL_TABLET | Freq: Four times a day (QID) | ORAL | Status: DC

## 2011-03-29 NOTE — Telephone Encounter (Signed)
Refilled 03/12/11

## 2011-04-29 ENCOUNTER — Other Ambulatory Visit: Payer: Self-pay | Admitting: Internal Medicine

## 2011-08-13 ENCOUNTER — Other Ambulatory Visit: Payer: Self-pay | Admitting: Internal Medicine

## 2011-08-13 MED ORDER — DIPHENOXYLATE-ATROPINE 2.5-0.025 MG PO TABS: 1.0000 | ORAL_TABLET | Freq: Four times a day (QID) | ORAL | Status: DC

## 2011-08-13 NOTE — Telephone Encounter (Signed)
request for refill of lomotil

## 2011-09-27 ENCOUNTER — Telehealth: Payer: Self-pay | Admitting: Internal Medicine

## 2011-09-27 NOTE — Telephone Encounter (Signed)
Try j uly 25 at [facility]

## 2011-09-27 NOTE — Telephone Encounter (Signed)
Pt could not come in July 25th pt stated he will wait until scheduled appt

## 2011-09-27 NOTE — Telephone Encounter (Signed)
Pt called and said that he is schd to come in to see Dr Arnoldo Morale on 11/08/11, but is req to get a work in ov in July. Offered pt an ov for 09/29/11, but pt was leaving to go out of town. Pt does want ov when pt gets back in town.

## 2011-10-07 ENCOUNTER — Other Ambulatory Visit: Payer: Self-pay | Admitting: Internal Medicine

## 2011-11-08 ENCOUNTER — Ambulatory Visit (INDEPENDENT_AMBULATORY_CARE_PROVIDER_SITE_OTHER): Payer: Commercial Managed Care - PPO | Admitting: Internal Medicine

## 2011-11-08 ENCOUNTER — Encounter: Payer: Self-pay | Admitting: Internal Medicine

## 2011-11-08 VITALS — BP 140/80 | HR 72 | Temp 98.2°F | Resp 16 | Ht 73.0 in | Wt 150.0 lb

## 2011-11-08 DIAGNOSIS — L570 Actinic keratosis: Secondary | ICD-10-CM

## 2011-11-08 DIAGNOSIS — M719 Bursopathy, unspecified: Secondary | ICD-10-CM

## 2011-11-08 DIAGNOSIS — K130 Diseases of lips: Secondary | ICD-10-CM

## 2011-11-08 DIAGNOSIS — B070 Plantar wart: Secondary | ICD-10-CM

## 2011-11-08 DIAGNOSIS — M7581 Other shoulder lesions, right shoulder: Secondary | ICD-10-CM

## 2011-11-08 DIAGNOSIS — M67919 Unspecified disorder of synovium and tendon, unspecified shoulder: Secondary | ICD-10-CM

## 2011-11-08 MED ORDER — METHYLPREDNISOLONE ACETATE 40 MG/ML IJ SUSP: 40.0000 mg | Freq: Once | INTRAMUSCULAR | Status: DC

## 2011-11-08 NOTE — Patient Instructions (Signed)
The patient is instructed to continue all medications as prescribed. Schedule followup with check out clerk upon leaving the clinic  

## 2011-11-08 NOTE — Progress Notes (Signed)
  Subjective:    Patient ID: Frank Larson, male    DOB: July 13, 1950, 61 y.o.   MRN: [id number]  HPI The pt has wart on feet. Suspicious mole on back chelosis of lipds Mole on nose treated with cryotherapy approximately one year ago with possible recurrence of lesion Shoulder pain in right, lifting right up and out and pain with lifting out/ up Has seen PT in the past      Review of Systems  Constitutional: Negative for fever and fatigue.  HENT: Negative for hearing loss, congestion, neck pain and postnasal drip.   Eyes: Negative for discharge, redness and visual disturbance.  Respiratory: Negative for cough, shortness of breath and wheezing.   Cardiovascular: Negative for leg swelling.  Gastrointestinal: Negative for abdominal pain, constipation and abdominal distention.  Genitourinary: Negative for urgency and frequency.  Musculoskeletal: Negative for joint swelling and arthralgias.  Skin: Negative for color change and rash.  Neurological: Negative for weakness and light-headedness.  Hematological: Negative for adenopathy.  Psychiatric/Behavioral: Negative for behavioral problems.       Objective:   Physical Exam  Nursing note and vitals reviewed. Constitutional: He appears well-developed and well-nourished.  HENT:  Head: Normocephalic and atraumatic.  Eyes: Conjunctivae are normal. Pupils are equal, round, and reactive to light.  Neck: Normal range of motion. Neck supple.  Cardiovascular: Normal rate and regular rhythm.   Pulmonary/Chest: Effort normal and breath sounds normal.  Abdominal: Soft. Bowel sounds are normal.  Musculoskeletal:       Range of motion is limitation consistent with rotator cuff partial tear on the right  Skin: There is erythema.       AK on nose  Plantar warts on the plantar surface of both feet 3 warts identified          Assessment & Plan:  Possible rotator cuff tear on the right. Exam suggests this. Diagnostic cortisone  injection. Has appointment with Sports medicine. Eustace Moore Fields) Next step MRI.  Informed consent obtained and the patient's right anterior shoulder was prepped with betadine. Local anesthesia was obtained with topical spray. Then 40 mg of Depo-Medrol and 1/2 cc of lidocaine was injected into the joint space. The patient tolerated the procedure without complications. Post injection care discussed with patient.   Patient has a small recurrent actinic keratosis on his nose this was retreated with cryotherapy since the size was less than 3 mm.     Patient has warts on the bottom of his feet to warts on the bottom of the left foot at the ball of foot and one wart on the right foot Both lesions were treated with cryotherapy and then excised with a #15 blade as appropriate treated with cryotherapy

## 2011-11-09 LAB — VITAMIN B12: Vitamin B-12: 340 pg/mL (ref 211–911)

## 2011-11-15 ENCOUNTER — Telehealth: Payer: Self-pay | Admitting: Internal Medicine

## 2011-11-15 NOTE — Telephone Encounter (Signed)
Per dr Arnoldo Morale low b12- try nascobal nasal spray 1 spray weekly-pt informed and sample up front for pt

## 2011-11-15 NOTE — Telephone Encounter (Signed)
Pt would like blood work results °

## 2011-11-22 ENCOUNTER — Ambulatory Visit: Payer: Commercial Managed Care - PPO | Admitting: Sports Medicine

## 2011-12-16 ENCOUNTER — Other Ambulatory Visit: Payer: Self-pay | Admitting: Internal Medicine

## 2011-12-17 ENCOUNTER — Other Ambulatory Visit: Payer: Self-pay | Admitting: *Deleted

## 2011-12-17 ENCOUNTER — Other Ambulatory Visit: Payer: Self-pay | Admitting: Internal Medicine

## 2011-12-17 MED ORDER — DIPHENOXYLATE-ATROPINE 2.5-0.025 MG PO TABS: 1.0000 | ORAL_TABLET | Freq: Four times a day (QID) | ORAL | Status: DC

## 2011-12-17 MED ORDER — BUDESONIDE 3 MG PO CP24: 3.0000 mg | ORAL_CAPSULE | Freq: Every day | ORAL | Status: DC

## 2011-12-17 NOTE — Telephone Encounter (Signed)
Refilled Lomotil and Entocort

## 2011-12-20 ENCOUNTER — Other Ambulatory Visit: Payer: Self-pay

## 2011-12-20 MED ORDER — BUDESONIDE 3 MG PO CP24: 9.0000 mg | ORAL_CAPSULE | Freq: Every day | ORAL | Status: DC

## 2011-12-20 NOTE — Telephone Encounter (Signed)
Corrected previously sent rx that instructed to take 1 3m tablet a day instead of 3 - correct dosage is 949ma day so instructions should read take 3 tablets a day

## 2011-12-23 ENCOUNTER — Other Ambulatory Visit: Payer: Self-pay | Admitting: *Deleted

## 2011-12-23 MED ORDER — DIPHENOXYLATE-ATROPINE 2.5-0.025 MG PO TABS: 1.0000 | ORAL_TABLET | Freq: Four times a day (QID) | ORAL | Status: DC

## 2012-02-29 ENCOUNTER — Other Ambulatory Visit (INDEPENDENT_AMBULATORY_CARE_PROVIDER_SITE_OTHER): Payer: Commercial Managed Care - PPO

## 2012-02-29 ENCOUNTER — Ambulatory Visit (INDEPENDENT_AMBULATORY_CARE_PROVIDER_SITE_OTHER): Payer: Commercial Managed Care - PPO | Admitting: Internal Medicine

## 2012-02-29 ENCOUNTER — Telehealth: Payer: Self-pay | Admitting: Internal Medicine

## 2012-02-29 ENCOUNTER — Encounter: Payer: Self-pay | Admitting: Internal Medicine

## 2012-02-29 VITALS — BP 120/80 | HR 56 | Ht 72.5 in | Wt 150.4 lb

## 2012-02-29 DIAGNOSIS — K5289 Other specified noninfective gastroenteritis and colitis: Secondary | ICD-10-CM

## 2012-02-29 DIAGNOSIS — K52832 Lymphocytic colitis: Secondary | ICD-10-CM

## 2012-02-29 DIAGNOSIS — E538 Deficiency of other specified B group vitamins: Secondary | ICD-10-CM

## 2012-02-29 DIAGNOSIS — R197 Diarrhea, unspecified: Secondary | ICD-10-CM

## 2012-02-29 LAB — IGA: IgA: 345 mg/dL (ref 68–378)

## 2012-02-29 MED ORDER — "NEEDLE (DISP) 25G X 1"" MISC": 1.0000 | Status: DC

## 2012-02-29 MED ORDER — BUDESONIDE 3 MG PO CP24: 9.0000 mg | ORAL_CAPSULE | Freq: Every day | ORAL | Status: DC

## 2012-02-29 MED ORDER — DIPHENOXYLATE-ATROPINE 2.5-0.025 MG PO TABS: 1.0000 | ORAL_TABLET | Freq: Four times a day (QID) | ORAL | Status: DC

## 2012-02-29 MED ORDER — CYANOCOBALAMIN 1000 MCG/ML IJ SOLN: 1000.0000 ug | Freq: Once | INTRAMUSCULAR | Status: DC

## 2012-02-29 NOTE — Telephone Encounter (Signed)
Pt would like refill for the needles and the B12 serum 1018mg/ml , a 6 (six) month supply.  (instead of 3 mo) Pls call pt when ready. [phone]

## 2012-02-29 NOTE — Patient Instructions (Addendum)
Your physician has requested that you go to the basement for lab work before leaving today  You have been scheduled for an endoscopy with propofol. Please follow written instructions given to you at your visit today. If you use inhalers (even only as needed) or a CPAP machine, please bring them with you on the day of your procedure.  We have sent the following medications to your pharmacy for you to pick up at your convenience:  Lomotil, Budesonide

## 2012-02-29 NOTE — Progress Notes (Signed)
HISTORY OF PRESENT ILLNESS:  Frank Larson is a 61 y.o. 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 In September 2011 he was placed on budesonide 9 mg daily. He has been seen sporadically since, but most recently 12/02/2010. At that time, he reported ongoing symptoms despite budesonide 9 mg daily and Lomotil one each morning. Testing for Clostridium difficile by PCR was negative. He was instructed to increase his Lomotil and followup in 4-6 weeks. He follows up at this time. He tells me that he continues with loose stools, approximately 2-3 episodes each morning possibly one later in the day. Budesonide has relieved problems with bloating and cramping, but does not seem to affect his diarrhea. Increasing Lomotil up to 3 or 4 daily does help. He denies new symptoms or problems, except for being diagnosed with B12 deficiency. He is now on replacement therapy. His weight has been stable. He tells me that his father had eosinophilic gastroenteritis. He requests medication refill.  REVIEW OF SYSTEMS:  All non-GI ROS negative except for sinus and allergy trouble, hearing problems  Past Medical History  Diagnosis Date  . Allergy   . Hyperlipidemia   . Diverticulosis   . GERD (gastroesophageal reflux disease)   . Arthritis   . Colitis     Past Surgical History  Procedure Date  . Colonoscopy 08/07/2002  . Inguinal hernia repair   . Nasal sinus surgery   . Nose surgery     broken nose    Social History BRANDUN PINN  reports that he has never smoked. He has never used smokeless tobacco. He reports that he drinks alcohol. He reports that he does not use illicit drugs.  family history includes Colitis in his father; Heart disease in his father; Hyperlipidemia in his father; Parkinson's disease in his mother; and Peptic Ulcer Disease in his father and mother.  There is no history of Colon cancer.  No Known Allergies     PHYSICAL  EXAMINATION: Vital signs: BP 120/80  Pulse 56  Ht 6' 0.5" (1.842 m)  Wt 150 lb 6 oz (68.21 kg)  BMI 20.11 kg/m2 General: Well-developed, well-nourished, no acute distress HEENT: Sclerae are anicteric, conjunctiva pink. Oral mucosa intact Lungs: Clear Heart: Regular Abdomen: soft, nontender, nondistended, no obvious ascites, no peritoneal signs, normal bowel sounds. No organomegaly. Extremities: No edema Psychiatric: alert and oriented x3. Cooperative    ASSESSMENT:  #1. Lymphocytic colitis. Biopsy-proven 2004. Entocort initiated 2011 with incomplete improvement. Rule out other contributing causes (particularly given recently identified B12 deficiency) versus refractory disease #2. History of GERD #3. B12 deficiency. Recently diagnosed   PLAN:  #1. Screen for celiac sprue with serologies #2. Upper endoscopy with gastric and small bowel biopsies.The nature of the procedure, as well as the risks, benefits, and alternatives were carefully and thoroughly reviewed with the patient. Ample time for discussion and questions allowed. The patient understood, was satisfied, and agreed to proceed.  #3. Due for surveillance colonoscopy 2014. It may be helpful rebiopsy his colonic mucosa at that time as well as provide neoplasia surveillance. #4. B12 replacement with Dr. Arnoldo Morale

## 2012-03-01 LAB — TISSUE TRANSGLUTAMINASE, IGA: Tissue Transglutaminase Ab, IgA: 8.6 U/mL (ref <20)

## 2012-03-08 ENCOUNTER — Encounter: Payer: Self-pay | Admitting: Sports Medicine

## 2012-03-08 ENCOUNTER — Ambulatory Visit (INDEPENDENT_AMBULATORY_CARE_PROVIDER_SITE_OTHER): Payer: Commercial Managed Care - PPO | Admitting: Sports Medicine

## 2012-03-08 VITALS — BP 132/90 | Ht 72.5 in | Wt 150.0 lb

## 2012-03-08 DIAGNOSIS — M216X9 Other acquired deformities of unspecified foot: Secondary | ICD-10-CM

## 2012-03-08 DIAGNOSIS — M79609 Pain in unspecified limb: Secondary | ICD-10-CM

## 2012-03-08 NOTE — Assessment & Plan Note (Signed)
With patient's breakdown of his transverse arch patient did have an addition of the metatarsal pad added. Patient's old orthotics did have significant breakdown and did have fracture of the orthotic itself meaning that it had outlived its life span. This likely was contributing to some of the metatarsal foot pain. Patient will try these new orthotics as well can come back at any point for any adjustments as necessary. I do not anticipate patient having any trouble with new custom orthotics. In addition to this the new metatarsal pads as well as the forefoot padding was added to his old insoles which she will keep as extra insoles.

## 2012-03-08 NOTE — Progress Notes (Signed)
Subjective: Patient is here for new orthotics to be made. Patient has worn the same custom orthotics for greater than 8 years per patient. Patient states that recently of the course of the last several months he started having more pain mostly over the ball of his foot bilaterally. Patient is still an avid runner and walker.   Patient's past medical history is no significant change except for right ankle fracture from falling out of a tree while birdwatching earlier this year. Patient states that he did heal well and has been able to do all his activities of daily living without any trouble. Patient feels that if he gets new orthotics likely most of the pain that he is having will improve.  Past medical history, social, surgical and family history all reviewed.   Denies fever, chills, nausea vomiting abdominal pain, dysuria, chest pain, shortness of breath dyspnea on exertion or numbness in extremities  Physical Exam Blood pressure 132/90, height 6' 0.5" (1.842 m), weight 150 lb (68.04 kg). General: No apparent distress alert and oriented x3 mood and affect somewhat normal but blunted. Respiratory: Patient's recent full sentences and does not appear short of breath Skin: Warm dry intact with no signs of infection or rash Neuro: Cranial nerves II through XII are intact, neurovascularly intact in all extremities with 2+ DTRs and 2+ pulses. Foot exam: Patient does have good longitudinal arch. Patient is having mild breakdown of the transverse arch seen with splaying of the first and second toes bilaterally right greater than left. Patient also has overpronation of the hindfoot right greater than left. No true fungus or varus deformity of the hindfoot. On plantar aspect of foot patient does have a hypertrophic cord lesion over the fourth metatarsal on the left side. Neurovascularly intact distally. With good dorsalis pedis pulse.  With ambulation patient does have mild overpronation bilaterally. Patient  with placed a new custom orthotics in patient was in a much more neutral position. Patient also states that the extra padding that we did put in his shoe seem to be an benficial and decrease the metatarsal pain.  Patient was fitted for a : standard, cushioned, semi-rigid orthotic. The orthotic was heated and afterward the patient stood on the orthotic blank positioned on the orthotic stand. The patient was positioned in subtalar neutral position and 10 degrees of ankle dorsiflexion in a weight bearing stance. After completion of molding, a stable base was applied to the orthotic blank. The blank was ground to a stable position for weight bearing. Size:10 Base:EVA Posting:metatarsal pad small bilaterally Additional orthotic padding: extra midfoot and metatarsal head padding.

## 2012-03-09 ENCOUNTER — Encounter: Payer: Self-pay | Admitting: Internal Medicine

## 2012-03-09 ENCOUNTER — Ambulatory Visit (AMBULATORY_SURGERY_CENTER): Payer: Commercial Managed Care - PPO | Admitting: Internal Medicine

## 2012-03-09 VITALS — BP 140/90 | HR 58 | Temp 96.5°F | Resp 14 | Ht 72.0 in | Wt 150.0 lb

## 2012-03-09 DIAGNOSIS — K5289 Other specified noninfective gastroenteritis and colitis: Secondary | ICD-10-CM

## 2012-03-09 DIAGNOSIS — R197 Diarrhea, unspecified: Secondary | ICD-10-CM

## 2012-03-09 DIAGNOSIS — K2100 Gastro-esophageal reflux disease with esophagitis, without bleeding: Secondary | ICD-10-CM

## 2012-03-09 DIAGNOSIS — D1330 Benign neoplasm of unspecified part of small intestine: Secondary | ICD-10-CM

## 2012-03-09 DIAGNOSIS — D131 Benign neoplasm of stomach: Secondary | ICD-10-CM

## 2012-03-09 MED ORDER — SODIUM CHLORIDE 0.9 % IV SOLN: 500.0000 mL | INTRAVENOUS | Status: DC

## 2012-03-09 NOTE — Progress Notes (Signed)
Propofol given over incremental dosages 

## 2012-03-09 NOTE — Progress Notes (Signed)
Called to room to assist during endoscopic procedure.  Patient ID and intended procedure confirmed with present staff. Received instructions for my participation in the procedure from the performing physician.  

## 2012-03-09 NOTE — Progress Notes (Signed)
Patient did not experience any of the following events: a burn prior to discharge; a fall within the facility; wrong site/side/patient/procedure/implant event; or a hospital transfer or hospital admission upon discharge from the facility. (G8907) Patient did not have preoperative order for IV antibiotic SSI prophylaxis. (G8918)  

## 2012-03-09 NOTE — Op Note (Signed)
[facility]  [address] [place], [address]   ENDOSCOPY PROCEDURE REPORT  PATIENT: Frank Larson, Frank Larson  MR#: [id number] BIRTHDATE: 08-23-1950 , 60  yrs. old GENDER: Male ENDOSCOPIST: Eustace Quail, MD REFERRED BY:  .  Self / Office PROCEDURE DATE:  03/09/2012 PROCEDURE:  EGD w/ biopsies ASA CLASS:     Class II INDICATIONS:  Chronic diarrhea. MEDICATIONS: MAC sedation, administered by CRNA and propofol (Diprivan) 168m IV TOPICAL ANESTHETIC: Cetacaine Spray  DESCRIPTION OF PROCEDURE: After the risks benefits and alternatives of the procedure were thoroughly explained, informed consent was obtained.  The LB GIF-H180 [id number] was introduced through the mouth and advanced to the third portion of the duodenum. Without limitations.  The instrument was slowly withdrawn as the mucosa was fully examined.      The esophagus revealed Grade 1 distal esophagitis.  The stomach was normal.  Antral bx taken.  The duodenum was normal.  duodenal bx taken.  Retroflexed views revealed a hiatal hernia.     The scope was then withdrawn from the patient and the procedure completed.  COMPLICATIONS: There were no complications. ENDOSCOPIC IMPRESSION: 1.The esophagus revealed Grade 1 distal esophagitis. 2. Otherwise normal exam  RECOMMENDATIONS: 1.  Await biopsy results 2.  Can use Prilosec OTC 20 mg daily if you have reflux symptoms or trouble swallowing 3.  Schedule a colonoscopy in 2014. Due for routine surveillance. Can reassess lymphocytic colitis / assess ileu (recent B12 deficiency)  REPEAT EXAM:  eSigned:  JEustace Quail MD 03/09/2012 4:16 PM   CEB:RAXEEVear Clock MD and The Patient

## 2012-03-09 NOTE — Patient Instructions (Addendum)
YOU HAD AN ENDOSCOPIC PROCEDURE TODAY AT [facility]: Refer to the procedure report that was given to you for any specific questions about what was found during the examination.  If the procedure report does not answer your questions, please call your gastroenterologist to clarify.  If you requested that your care partner not be given the details of your procedure findings, then the procedure report has been included in a sealed envelope for you to review at your convenience later.  YOU SHOULD EXPECT: Some feelings of bloating in the abdomen. Passage of more gas than usual.  Walking can help get rid of the air that was put into your GI tract during the procedure and reduce the bloating. If you had a lower endoscopy (such as a colonoscopy or flexible sigmoidoscopy) you may notice spotting of blood in your stool or on the toilet paper. If you underwent a bowel prep for your procedure, then you may not have a normal bowel movement for a few days.  DIET: Your first meal following the procedure should be a light meal and then it is ok to progress to your normal diet.  A half-sandwich or bowl of soup is an example of a good first meal.  Heavy or fried foods are harder to digest and may make you feel nauseous or bloated.  Likewise meals heavy in dairy and vegetables can cause extra gas to form and this can also increase the bloating.  Drink plenty of fluids but you should avoid alcoholic beverages for 24 hours.  ACTIVITY: Your care partner should take you home directly after the procedure.  You should plan to take it easy, moving slowly for the rest of the day.  You can resume normal activity the day after the procedure however you should NOT DRIVE or use heavy machinery for 24 hours (because of the sedation medicines used during the test).    SYMPTOMS TO REPORT IMMEDIATELY: A gastroenterologist can be reached at any hour.  During normal business hours, 8:30 AM to 5:00 PM Monday through Friday,  call [phone].  After hours and on weekends, please call the GI answering service at [phone] who will take a message and have the physician on call contact you.  Following upper endoscopy (EGD)  Vomiting of blood or coffee ground material  New chest pain or pain under the shoulder blades  Painful or persistently difficult swallowing  New shortness of breath  Fever of 100F or higher  Black, tarry-looking stools  FOLLOW UP: If any biopsies were taken you will be contacted by phone or by letter within the next 1-3 weeks.  Call your gastroenterologist if you have not heard about the biopsies in 3 weeks.  Our staff will call the home number listed on your records the next business day following your procedure to check on you and address any questions or concerns that you may have at that time regarding the information given to you following your procedure. This is a courtesy call and so if there is no answer at the home number and we have not heard from you through the emergency physician on call, we will assume that you have returned to your regular daily activities without incident.  SIGNATURES/CONFIDENTIALITY: You and/or your care partner have signed paperwork which will be entered into your electronic medical record.  These signatures attest to the fact that that the information above on your After Visit Summary has been reviewed and is understood.  Full responsibility of  the confidentiality of this discharge information lies with you and/or your care-partner.   Resume medications. Information given on esophagitis with discharge information.

## 2012-03-10 ENCOUNTER — Telehealth: Payer: Self-pay

## 2012-03-10 NOTE — Telephone Encounter (Signed)
  Follow up Call-  Call back number 03/09/2012  Post procedure Call Back phone  # [phone]  Permission to leave phone message Yes     Patient questions:  Do you have a fever, pain , or abdominal swelling? no Pain Score  0 *  Have you tolerated food without any problems? yes  Have you been able to return to your normal activities? no  Do you have any questions about your discharge instructions: Diet   no Medications  no Follow up visit  no  Do you have questions or concerns about your Care? no  Actions: * If pain score is 4 or above: No action needed, pain <4.

## 2012-03-15 ENCOUNTER — Encounter: Payer: Self-pay | Admitting: Internal Medicine

## 2012-04-10 ENCOUNTER — Encounter: Payer: Self-pay | Admitting: Internal Medicine

## 2012-04-10 ENCOUNTER — Telehealth: Payer: Self-pay | Admitting: Internal Medicine

## 2012-04-10 NOTE — Telephone Encounter (Signed)
Patient called stating that he would like to see Dr. Arnoldo Morale the week of 04/24/12 and in the past he has been told by the MD that he does not have to wait a long time for visits. Please assist.

## 2012-04-10 NOTE — Telephone Encounter (Signed)
appt 1-31-pt informed and also told pt that his next avaiable appointmetn is in march or April becaue he is only working 2.5 days.  Also encouraged to please try and give [place] some for next ov

## 2012-04-28 ENCOUNTER — Ambulatory Visit (INDEPENDENT_AMBULATORY_CARE_PROVIDER_SITE_OTHER): Payer: 59 | Admitting: Internal Medicine

## 2012-04-28 ENCOUNTER — Encounter: Payer: Self-pay | Admitting: Internal Medicine

## 2012-04-28 VITALS — BP 120/80 | HR 56 | Temp 98.2°F | Wt 151.0 lb

## 2012-04-28 DIAGNOSIS — M79609 Pain in unspecified limb: Secondary | ICD-10-CM

## 2012-04-28 DIAGNOSIS — B079 Viral wart, unspecified: Secondary | ICD-10-CM

## 2012-04-28 DIAGNOSIS — M79673 Pain in unspecified foot: Secondary | ICD-10-CM

## 2012-04-28 NOTE — Patient Instructions (Signed)
Place a dab of Neosporin and a new bandage until a scab appears over the cryo site. After the scab appears there is no need to keep a dressing in place unless the area can be irritated by clothing. Call our office if redness around the biopsy site appears and begins to spread more than a half inch from the site.

## 2012-04-28 NOTE — Progress Notes (Signed)
  Subjective:    Patient ID: Frank Larson, male    DOB: 08/09/1950, 62 y.o.   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     Review of Systems  Constitutional: Negative for fever and fatigue.  HENT: Negative for hearing loss, congestion, neck pain and postnasal drip.   Eyes: Negative for discharge, redness and visual disturbance.  Respiratory: Negative for cough, shortness of breath and wheezing.   Cardiovascular: Negative for leg swelling.  Gastrointestinal: Negative for abdominal pain, constipation and abdominal distention.  Genitourinary: Negative for urgency and frequency.  Musculoskeletal: Negative for joint swelling and arthralgias.  Skin: Positive for rash. Negative for color change.       Warts bilaterally on the base of the foot  Neurological: Negative for weakness and light-headedness.  Hematological: Negative for adenopathy.  Psychiatric/Behavioral: Negative for behavioral problems.       Objective:   Physical Exam  Nursing note and vitals reviewed. Constitutional: He appears well-developed and well-nourished.  HENT:  Head: Normocephalic and atraumatic.  Eyes: Conjunctivae normal are normal. Pupils are equal, round, and reactive to light.  Neck: Normal range of motion. Neck supple.  Cardiovascular: Normal rate and regular rhythm.   Pulmonary/Chest: Effort normal and breath sounds normal.  Abdominal: Soft. Bowel sounds are normal.  Skin:       A cluster of plantar warts approximately 7 on the left foot a cluster of plantar warts approximately 3 on the right foot          Assessment & Plan:  Skin check for plantar warts and identification of warts on both the left and foot right foot  Informed consent was obtained, the lesions were treated for 60 seconds of liquid nitrogen  application the patient tolerated the procedure well as procedural care was discussed with the patient and instructions should the lesion reappears contact our office immediately  Approximately 7 lesions were treated on the left foot and 3 lesions were treated on the right foot.  After cryotherapy we excised dead tissue from both the left and the right foot on the largest lesions and reapplied cryotherapy

## 2012-05-05 ENCOUNTER — Other Ambulatory Visit: Payer: Self-pay | Admitting: Internal Medicine

## 2012-05-25 ENCOUNTER — Encounter: Payer: Self-pay | Admitting: Internal Medicine

## 2012-05-26 ENCOUNTER — Encounter: Payer: Commercial Managed Care - PPO | Admitting: Internal Medicine

## 2012-05-30 ENCOUNTER — Encounter: Payer: Self-pay | Admitting: Family

## 2012-05-30 ENCOUNTER — Ambulatory Visit (INDEPENDENT_AMBULATORY_CARE_PROVIDER_SITE_OTHER): Payer: 59 | Admitting: Family

## 2012-05-30 VITALS — BP 126/88 | HR 81 | Wt 153.0 lb

## 2012-05-30 DIAGNOSIS — B079 Viral wart, unspecified: Secondary | ICD-10-CM

## 2012-05-30 NOTE — Patient Instructions (Addendum)
Cryotherapy Cryotherapy means treatment with cold. Ice or gel packs can be used to reduce both pain and swelling. Ice is the most helpful within the first 24 to 48 hours after an injury or flareup from overusing a muscle or joint. Sprains, strains, spasms, burning pain, shooting pain, and aches can all be eased with ice. Ice can also be used when recovering from surgery. Ice is effective, has very few side effects, and is safe for most people to use. PRECAUTIONS  Ice is not a safe treatment option for people with:  Raynaud's phenomenon. This is a condition affecting small blood vessels in the extremities. Exposure to cold may cause your problems to return.  Cold hypersensitivity. There are many forms of cold hypersensitivity, including:  Cold urticaria. Red, itchy hives appear on the skin when the tissues begin to warm after being iced.  Cold erythema. This is a red, itchy rash caused by exposure to cold.  Cold hemoglobinuria. Red blood cells break down when the tissues begin to warm after being iced. The hemoglobin that carry oxygen are passed into the urine because they cannot combine with blood proteins fast enough.  Numbness or altered sensitivity in the area being iced. If you have any of the following conditions, do not use ice until you have discussed cryotherapy with your caregiver:  Heart conditions, such as arrhythmia, angina, or chronic heart disease.  High blood pressure.  Healing wounds or open skin in the area being iced.  Current infections.  Rheumatoid arthritis.  Poor circulation.  Diabetes. Ice slows the blood flow in the region it is applied. This is beneficial when trying to stop inflamed tissues from spreading irritating chemicals to surrounding tissues. However, if you expose your skin to cold temperatures for too long or without the proper protection, you can damage your skin or nerves. Watch for signs of skin damage due to cold. HOME CARE INSTRUCTIONS Follow  these tips to use ice and cold packs safely.  Place a dry or damp towel between the ice and skin. A damp towel will cool the skin more quickly, so you may need to shorten the time that the ice is used.  For a more rapid response, add gentle compression to the ice.  Ice for no more than 10 to 20 minutes at a time. The bonier the area you are icing, the less time it will take to get the benefits of ice.  Check your skin after 5 minutes to make sure there are no signs of a poor response to cold or skin damage.  Rest 20 minutes or more in between uses.  Once your skin is numb, you can end your treatment. You can test numbness by very lightly touching your skin. The touch should be so light that you do not see the skin dimple from the pressure of your fingertip. When using ice, most people will feel these normal sensations in this order: cold, burning, aching, and numbness.  Do not use ice on someone who cannot communicate their responses to pain, such as small children or people with dementia. HOW TO MAKE AN ICE PACK Ice packs are the most common way to use ice therapy. Other methods include ice massage, ice baths, and cryo-sprays. Muscle creams that cause a cold, tingly feeling do not offer the same benefits that ice offers and should not be used as a substitute unless recommended by your caregiver. To make an ice pack, do one of the following:  Place crushed ice or  a bag of frozen vegetables in a sealable plastic bag. Squeeze out the excess air. Place this bag inside another plastic bag. Slide the bag into a pillowcase or place a damp towel between your skin and the bag.  Mix 3 parts water with 1 part rubbing alcohol. Freeze the mixture in a sealable plastic bag. When you remove the mixture from the freezer, it will be slushy. Squeeze out the excess air. Place this bag inside another plastic bag. Slide the bag into a pillowcase or place a damp towel between your skin and the bag. SEEK MEDICAL  CARE IF:  You develop white spots on your skin. This may give the skin a blotchy (mottled) appearance.  Your skin turns blue or pale.  Your skin becomes waxy or hard.  Your swelling gets worse. MAKE SURE YOU:   Understand these instructions.  Will watch your condition.  Will get help right away if you are not doing well or get worse. Document Released: 11/09/2010 Document Revised: 06/07/2011 Document Reviewed: 11/09/2010 [facility] Patient Information 2013 [place].   Incision Care An incision is when a surgeon cuts into your body tissues. After surgery, the incision needs to be cared for properly to prevent infection.  HOME CARE INSTRUCTIONS   Take all medicine as directed by your caregiver. Only take over-the-counter or prescription medicines for pain, discomfort, or fever as directed by your caregiver.  Do not remove your bandage (dressing) or get your incision wet until your surgeon gives you permission. In the event that your dressing becomes wet, dirty, or starts to smell, change the dressing and call your surgeon for instructions as soon as possible.  Take showers. Do not take tub baths, swim, or do anything that may soak the wound until it is healed.  Resume your normal diet and activities as directed or allowed.  Avoid lifting any weight until you are instructed otherwise.  Use anti-itch antihistamine medicine as directed by your caregiver. The wound may itch when it is healing. Do not pick or scratch at the wound.  Follow up with your caregiver for stitch (suture) or staple removal as directed.  Drink enough fluids to keep your urine clear or pale yellow. SEEK MEDICAL CARE IF:   You have redness, swelling, or increasing pain in the wound that is not controlled with medicine.  You have drainage, blood, or pus coming from the wound that lasts longer than 1 day.  You develop muscle aches, chills, or a general ill feeling.  You notice a bad smell coming from  the wound or dressing.  Your wound edges separate after the sutures, staples, or skin adhesive strips have been removed.  You develop persistent nausea or vomiting. SEEK IMMEDIATE MEDICAL CARE IF:   You have a fever.  You develop a rash.  You develop dizzy episodes or faint while standing.  You have difficulty breathing.  You develop any reaction or side effects to medicine given. MAKE SURE YOU:   Understand these instructions.  Will watch your condition.  Will get help right away if you are not doing well or get worse. Document Released: 10/02/2004 Document Revised: 06/07/2011 Document Reviewed: 07/19/2010 [facility] Patient Information 2013 [place].

## 2012-05-30 NOTE — Progress Notes (Signed)
Subjective:    Patient ID: Frank Larson, male    DOB: December 01, 1950, 62 y.o.   MRN: [id number]  HPI 62 year old white male, nonsmoker, patient of Dr. Arnoldo Morale is in with complaints of warts on the bottom of his feet bilaterally. He was seen by Dr. Arnoldo Morale in January that initiated cryotherapy that was ineffective. He is requesting to have the wart on his left foot excised. Meanwhile, he has 2 small warts to the right plantar aspect of the foot. They are all painful    Review of Systems  Constitutional: Negative.   Respiratory: Negative.   Cardiovascular: Negative.   Skin:       2 warts to the right plantar foot and a larger one to the left   Past Medical History  Diagnosis Date  . Allergy   . Hyperlipidemia   . Diverticulosis   . GERD (gastroesophageal reflux disease)   . Arthritis   . Colitis     History   Social History  . Marital Status: Married    Spouse Name: N/A    Number of Children: 1  . Years of Education: N/A   Occupational History  . psychologist    Social History Main Topics  . Smoking status: Never Smoker   . Smokeless tobacco: Never Used  . Alcohol Use: 2.4 oz/week    4 Glasses of wine per week     Comment: occasionally  . Drug Use: No  . Sexually Active: Yes   Other Topics Concern  . Not on file   Social History Narrative   Daily caffeine----regular exercise             Past Surgical History  Procedure Laterality Date  . Colonoscopy  08/07/2002  . Inguinal hernia repair    . Nasal sinus surgery    . Nose surgery      broken nose    Family History  Problem Relation Age of Onset  . Colon cancer Neg Hx   . Hyperlipidemia Father   . Heart disease Father   . Parkinson's disease Mother   . Peptic Ulcer Disease Father   . Peptic Ulcer Disease Mother   . Colitis Father     No Known Allergies  Current Outpatient Prescriptions on File Prior to Visit  Medication Sig Dispense Refill  . atorvastatin (LIPITOR) 10 MG tablet TAKE 1 TABLET BY  MOUTH ONCE DAILY  90 tablet  PRN  . budesonide (ENTOCORT EC) 3 MG 24 hr capsule Take 3 capsules (9 mg total) by mouth daily.  270 capsule  0  . cyanocobalamin (,VITAMIN B-12,) 1000 MCG/ML injection Inject 1 mL (1,000 mcg total) into the muscle once.  6 mL  1  . diphenoxylate-atropine (LOMOTIL) 2.5-0.025 MG per tablet Take 1 tablet by mouth 4 (four) times daily.  360 tablet  0  . fluticasone (FLONASE) 50 MCG/ACT nasal spray USE 2 SPRAYS IN EACH NOSTRIL ONCE DAILY  48 g  3  . NEEDLE, DISP, 25 G (B-D DISP NEEDLE 25GX1") 25G X 1" MISC 1 Syringe by Does not apply route every 30 (thirty) days.  6 each  1   No current facility-administered medications on file prior to visit.    BP 126/88  Pulse 81  Wt 153 lb (69.4 kg)  BMI 20.75 kg/m2  SpO2 97%chart    Objective:   Physical Exam  Constitutional: He is oriented to person, place, and time. He appears well-developed and well-nourished.  Cardiovascular: Normal rate, regular rhythm and normal heart  sounds.   Neurological: He is alert and oriented to person, place, and time.  Skin: Skin is warm and dry.  3 mm wart noted to the plantar aspect of the right foot. Has a second wart is approximately 2 mm and palpable.   left foot viral wart tender to palpation.  Psychiatric: He has a normal mood and affect.    Right foot: Informed consent was obtained and the site was treated with 20 % acetic acid. The color of the lesion changed to white with the application of acid. The patient tolerated the procedure well and  aftercare instructions were given to the patient.  Left foot: Informed consent was given by the patient for a excision of the wart. The site was prepped with Betadine and using a 11 blade a .5  cm excision was obtained. The specimin was placed in preservative and sent for pathology. Hemostasis was achieved with a compression. Wound care was discussed with the patient. The patient was informed that it would be one to       Assessment & Plan:   Assessment:  1. Viral wart   Plan: Care was discussed of the excision. Signs and symptoms of infection discussed. Followup as needed.

## 2012-06-26 ENCOUNTER — Other Ambulatory Visit: Payer: Self-pay | Admitting: Internal Medicine

## 2012-06-27 ENCOUNTER — Ambulatory Visit: Payer: 59 | Admitting: Family

## 2012-06-27 ENCOUNTER — Ambulatory Visit (INDEPENDENT_AMBULATORY_CARE_PROVIDER_SITE_OTHER): Payer: 59 | Admitting: Family

## 2012-06-27 ENCOUNTER — Encounter: Payer: Self-pay | Admitting: Family

## 2012-06-27 VITALS — BP 132/78 | HR 73 | Wt 151.0 lb

## 2012-06-27 DIAGNOSIS — B351 Tinea unguium: Secondary | ICD-10-CM

## 2012-06-27 NOTE — Progress Notes (Signed)
Subjective:    Patient ID: Frank Larson, male    DOB: 02/10/1951, 62 y.o.   MRN: [id number]  HPI 62 year old white male, nonsmoker, patient of Dr. Arnoldo Morale is in today with complaints of pain in his right great toe related to a foot fungus that's been grown out x1 year. The fungus has nearly grown completely out. However, he has tenderness to the left lateral aspect of the great toe. Denies any injury. The pain is particularly worse with certain shoes. No drainage or discharge from the foot.   Review of Systems  Constitutional: Negative.   Respiratory: Negative.   Cardiovascular: Negative.   Musculoskeletal:       Right great toe pain and nail fungus-improving  Neurological: Negative.   Hematological: Negative.    Past Medical History  Diagnosis Date  . Allergy   . Hyperlipidemia   . Diverticulosis   . GERD (gastroesophageal reflux disease)   . Arthritis   . Colitis     History   Social History  . Marital Status: Married    Spouse Name: N/A    Number of Children: 1  . Years of Education: N/A   Occupational History  . psychologist    Social History Main Topics  . Smoking status: Never Smoker   . Smokeless tobacco: Never Used  . Alcohol Use: 2.4 oz/week    4 Glasses of wine per week     Comment: occasionally  . Drug Use: No  . Sexually Active: Yes   Other Topics Concern  . Not on file   Social History Narrative   Daily caffeine----regular exercise             Past Surgical History  Procedure Laterality Date  . Colonoscopy  08/07/2002  . Inguinal hernia repair    . Nasal sinus surgery    . Nose surgery      broken nose    Family History  Problem Relation Age of Onset  . Colon cancer Neg Hx   . Hyperlipidemia Father   . Heart disease Father   . Parkinson's disease Mother   . Peptic Ulcer Disease Father   . Peptic Ulcer Disease Mother   . Colitis Father     No Known Allergies  Current Outpatient Prescriptions on File Prior to Visit  Medication  Sig Dispense Refill  . atorvastatin (LIPITOR) 10 MG tablet TAKE 1 TABLET BY MOUTH ONCE DAILY  90 tablet  PRN  . budesonide (ENTOCORT EC) 3 MG 24 hr capsule TAKE 3 CAPSULES BY MOUTH DAILY. NEED OFFICE VISIT FOR MORE REFILLS.  270 capsule  0  . cyanocobalamin (,VITAMIN B-12,) 1000 MCG/ML injection Inject 1 mL (1,000 mcg total) into the muscle once.  6 mL  1  . diphenoxylate-atropine (LOMOTIL) 2.5-0.025 MG per tablet Take 1 tablet by mouth 4 (four) times daily.  360 tablet  0  . fluticasone (FLONASE) 50 MCG/ACT nasal spray USE 2 SPRAYS IN EACH NOSTRIL ONCE DAILY  48 g  3  . NEEDLE, DISP, 25 G (B-D DISP NEEDLE 25GX1") 25G X 1" MISC 1 Syringe by Does not apply route every 30 (thirty) days.  6 each  1   No current facility-administered medications on file prior to visit.    BP 132/78  Pulse 73  Wt 151 lb (68.493 kg)  BMI 20.47 kg/m2  SpO2 98%chart    Objective:   Physical Exam  Constitutional: He is oriented to person, place, and time. He appears well-developed and well-nourished.  Cardiovascular:  Normal rate, regular rhythm and normal heart sounds.   Pulmonary/Chest: Effort normal and breath sounds normal.  Neurological: He is alert and oriented to person, place, and time.  Skin: Skin is warm.  Right great toe: Minimal nail fungus present. Mild tenderness to palpation of the left lateral great toe. No drainage or discharge. No swelling, no redness. No deformity.  Psychiatric: He has a normal mood and affect.          Assessment & Plan:  Assessment:  1. Right great toe pain 2. Onychomycosis  Plan: I believe his tenderness may be attributed to an overgrowth of the cuticle. Advised him to consider a pedicure. However, it is not uncommon to have some tenderness with nail fungus involvement. The nail fungus has improved significantly and at this point doesn't require treatment.

## 2012-07-03 ENCOUNTER — Ambulatory Visit: Payer: 59 | Admitting: Family

## 2012-07-10 ENCOUNTER — Telehealth: Payer: Self-pay | Admitting: Internal Medicine

## 2012-07-10 NOTE — Telephone Encounter (Signed)
Patient advised.  He will come in for a pre-visit for 07/11/12, his procedure is scheduled for 07/28/12

## 2012-07-10 NOTE — Telephone Encounter (Signed)
I am glad that his diarrhea is better. However, he is due for routine repeat screening colonoscopy as his last exam was May 2004. As such, he should schedule colonoscopy at his nearest convenience. Please let him know. Thank you

## 2012-07-10 NOTE — Telephone Encounter (Signed)
Left message for patient to call back  

## 2012-07-10 NOTE — Telephone Encounter (Signed)
Patient did not come for previsit today, he called asking if he should go ahead and have colonoscopy. Patient states that he was taking the Lomotil at bedtime instead of 12 hours after 1 st dose. So now that he has changed that and started taking the lomotil 12 hours apart he states his diarrhea has improved. Patient questions if he still needs the colonoscopy since diarrhea improved. Patient wants to talk with Dr.Perry about wether he should go ahead and have the colonoscopy. Please advise. Thanks

## 2012-07-12 ENCOUNTER — Ambulatory Visit (AMBULATORY_SURGERY_CENTER): Payer: 59

## 2012-07-12 ENCOUNTER — Other Ambulatory Visit: Payer: Self-pay | Admitting: Internal Medicine

## 2012-07-12 VITALS — Ht 73.0 in | Wt 154.6 lb

## 2012-07-12 DIAGNOSIS — Z1211 Encounter for screening for malignant neoplasm of colon: Secondary | ICD-10-CM

## 2012-07-12 MED ORDER — MOVIPREP 100 G PO SOLR: 1.0000 | Freq: Once | ORAL | Status: DC

## 2012-07-28 ENCOUNTER — Encounter: Payer: Self-pay | Admitting: Internal Medicine

## 2012-07-28 ENCOUNTER — Ambulatory Visit (AMBULATORY_SURGERY_CENTER): Payer: 59 | Admitting: Internal Medicine

## 2012-07-28 VITALS — BP 145/94 | HR 57 | Temp 96.9°F | Resp 19 | Ht 73.0 in | Wt 154.0 lb

## 2012-07-28 DIAGNOSIS — K5289 Other specified noninfective gastroenteritis and colitis: Secondary | ICD-10-CM

## 2012-07-28 DIAGNOSIS — K52832 Lymphocytic colitis: Secondary | ICD-10-CM

## 2012-07-28 DIAGNOSIS — Z1211 Encounter for screening for malignant neoplasm of colon: Secondary | ICD-10-CM

## 2012-07-28 DIAGNOSIS — K520 Gastroenteritis and colitis due to radiation: Secondary | ICD-10-CM

## 2012-07-28 MED ORDER — SODIUM CHLORIDE 0.9 % IV SOLN: 500.0000 mL | INTRAVENOUS | Status: DC

## 2012-07-28 NOTE — Op Note (Signed)
[place]  [address]. [place], [id number]   COLONOSCOPY PROCEDURE REPORT  PATIENT: [name], [name]  MR#: [id number] BIRTHDATE: 07-01-1950 , 61  yrs. old GENDER: Male ENDOSCOPIST: Eustace Quail, MD REFERRED VP:[id number] Recall, PROCEDURE DATE:  07/28/2012 PROCEDURE:   Colonoscopy with biopsies ASA CLASS:   Class II INDICATIONS:Average risk patient for colon cancer.; Negative exam 2004   ; Hx microscopic colitis; diarrhea MEDICATIONS: MAC sedation, administered by CRNA and propofol (Diprivan) 19m IV  DESCRIPTION OF PROCEDURE:   After the risks benefits and alternatives of the procedure were thoroughly explained, informed consent was obtained.  A digital rectal exam revealed no abnormalities of the rectum.   The LB CF-[id number] endoscope was introduced through the anus and advanced to the cecum, which was identified by both the appendix and ileocecal valve. No adverse events experienced.   The quality of the prep was excellent, using MoviPrep  The instrument was then slowly withdrawn as the colon was fully examined.      COLON FINDINGS: Moderate diverticulosis was noted in the left colon. The colon was otherwise normal.  There was no inflammation, polyps or cancers.   The mucosa appeared normal in the terminal ileum. Random colon bx taken. Retroflexed views revealed no abnormalities. The time to cecum=1 minutes 52 seconds.  Withdrawal time=10 minutes 17 seconds.  The scope was withdrawn and the procedure completed. COMPLICATIONS: There were no complications.  ENDOSCOPIC IMPRESSION: 1.   Moderate diverticulosis was noted in the left colon 2.   The colon was otherwise normal 3.   Normal mucosa in the terminal ileum  RECOMMENDATIONS: 1.  Await biopsy results 2.  Continue current medications 3.  Continue current colorectal screening recommendations for "routine risk" patients with a repeat colonoscopy in 10 years.   eSigned:  JEustace Quail MD 07/28/2012 10:11 AM  cc: The Patient and JRicard Dillon MD   PATIENT NAME:  Frank Larson, StemmMR#: [id number]

## 2012-07-28 NOTE — Progress Notes (Signed)
Patient did not experience any of the following events: a burn prior to discharge; a fall within the facility; wrong site/side/patient/procedure/implant event; or a hospital transfer or hospital admission upon discharge from the facility. (G8907) Patient did not have preoperative order for IV antibiotic SSI prophylaxis. (G8918)  

## 2012-07-28 NOTE — Progress Notes (Signed)
A/ox3 pleased with MAC report to [facility]

## 2012-07-28 NOTE — Patient Instructions (Addendum)
YOU HAD AN ENDOSCOPIC PROCEDURE TODAY AT [facility]: Refer to the procedure report that was given to you for any specific questions about what was found during the examination.  If the procedure report does not answer your questions, please call your gastroenterologist to clarify.  If you requested that your care partner not be given the details of your procedure findings, then the procedure report has been included in a sealed envelope for you to review at your convenience later.  YOU SHOULD EXPECT: Some feelings of bloating in the abdomen. Passage of more gas than usual.  Walking can help get rid of the air that was put into your GI tract during the procedure and reduce the bloating. If you had a lower endoscopy (such as a colonoscopy or flexible sigmoidoscopy) you may notice spotting of blood in your stool or on the toilet paper. If you underwent a bowel prep for your procedure, then you may not have a normal bowel movement for a few days.  DIET: Your first meal following the procedure should be a light meal and then it is ok to progress to your normal diet.  A half-sandwich or bowl of soup is an example of a good first meal.  Heavy or fried foods are harder to digest and may make you feel nauseous or bloated.  Likewise meals heavy in dairy and vegetables can cause extra gas to form and this can also increase the bloating.  Drink plenty of fluids but you should avoid alcoholic beverages for 24 hours.  ACTIVITY: Your care partner should take you home directly after the procedure.  You should plan to take it easy, moving slowly for the rest of the day.  You can resume normal activity the day after the procedure however you should NOT DRIVE or use heavy machinery for 24 hours (because of the sedation medicines used during the test).    SYMPTOMS TO REPORT IMMEDIATELY: A gastroenterologist can be reached at any hour.  During normal business hours, 8:30 AM to 5:00 PM Monday through Friday,  call [phone].  After hours and on weekends, please call the GI answering service at [phone] who will take a message and have the physician on call contact you.   Following lower endoscopy (colonoscopy or flexible sigmoidoscopy):  Excessive amounts of blood in the stool  Significant tenderness or worsening of abdominal pains  Swelling of the abdomen that is new, acute  Fever of 100F or higher  FOLLOW UP: If any biopsies were taken you will be contacted by phone or by letter within the next 1-3 weeks.  Call your gastroenterologist if you have not heard about the biopsies in 3 weeks.  Our staff will call the home number listed on your records the next business day following your procedure to check on you and address any questions or concerns that you may have at that time regarding the information given to you following your procedure. This is a courtesy call and so if there is no answer at the home number and we have not heard from you through the emergency physician on call, we will assume that you have returned to your regular daily activities without incident.  SIGNATURES/CONFIDENTIALITY: You and/or your care partner have signed paperwork which will be entered into your electronic medical record.  These signatures attest to the fact that that the information above on your After Visit Summary has been reviewed and is understood.  Full responsibility of the confidentiality of this  discharge information lies with you and/or your care-partner.  Continue your current medications  Await biopsy results  Follow up colonoscopy in 10 years  Please read over information about diverticulosis and high fiber diets

## 2012-07-31 ENCOUNTER — Telehealth: Payer: Self-pay | Admitting: *Deleted

## 2012-07-31 NOTE — Telephone Encounter (Signed)
  Follow up Call-  Call back number 07/28/2012 03/09/2012  Post procedure Call Back phone  # [phone]  Permission to leave phone message Yes Yes     Patient questions:  Do you have a fever, pain , or abdominal swelling? no Pain Score  0 *  Have you tolerated food without any problems? yes  Have you been able to return to your normal activities? yes  Do you have any questions about your discharge instructions: Diet   no Medications  no Follow up visit  no  Do you have questions or concerns about your Care? no  Actions: * If pain score is 4 or above: No action needed, pain <4.

## 2012-08-02 ENCOUNTER — Encounter: Payer: Self-pay | Admitting: Internal Medicine

## 2012-09-20 ENCOUNTER — Telehealth: Payer: Self-pay | Admitting: Internal Medicine

## 2012-09-20 NOTE — Telephone Encounter (Signed)
Pt aware b12. Will come in 6/26 for injection.

## 2012-09-20 NOTE — Telephone Encounter (Signed)
lmom for pt to call back

## 2012-09-20 NOTE — Telephone Encounter (Signed)
Pt would like you to call him for b12 injection.

## 2012-09-21 ENCOUNTER — Ambulatory Visit (INDEPENDENT_AMBULATORY_CARE_PROVIDER_SITE_OTHER): Payer: 59 | Admitting: *Deleted

## 2012-09-21 DIAGNOSIS — D518 Other vitamin B12 deficiency anemias: Secondary | ICD-10-CM

## 2012-09-21 DIAGNOSIS — D519 Vitamin B12 deficiency anemia, unspecified: Secondary | ICD-10-CM

## 2012-09-21 MED ORDER — CYANOCOBALAMIN 1000 MCG/ML IJ SOLN
1000.0000 ug | INTRAMUSCULAR | Status: AC
  Administered 2012-09-21: 1000 ug via INTRAMUSCULAR

## 2012-10-09 ENCOUNTER — Telehealth: Payer: Self-pay

## 2012-10-09 ENCOUNTER — Other Ambulatory Visit: Payer: Self-pay | Admitting: Internal Medicine

## 2012-10-09 NOTE — Telephone Encounter (Signed)
Refilled Lomotil

## 2012-10-10 ENCOUNTER — Other Ambulatory Visit: Payer: Self-pay | Admitting: Internal Medicine

## 2012-10-23 ENCOUNTER — Other Ambulatory Visit: Payer: Self-pay | Admitting: Internal Medicine

## 2012-10-30 ENCOUNTER — Other Ambulatory Visit: Payer: Self-pay

## 2012-10-30 MED ORDER — BUDESONIDE 3 MG PO CP24: 9.0000 mg | ORAL_CAPSULE | Freq: Every day | ORAL | Status: DC

## 2012-11-16 ENCOUNTER — Ambulatory Visit (INDEPENDENT_AMBULATORY_CARE_PROVIDER_SITE_OTHER): Payer: 59 | Admitting: Family Medicine

## 2012-11-16 ENCOUNTER — Encounter: Payer: Self-pay | Admitting: Family Medicine

## 2012-11-16 VITALS — BP 156/85 | HR 57 | Ht 73.0 in | Wt 158.0 lb

## 2012-11-16 DIAGNOSIS — S838X9A Sprain of other specified parts of unspecified knee, initial encounter: Secondary | ICD-10-CM

## 2012-11-16 DIAGNOSIS — S86112A Strain of other muscle(s) and tendon(s) of posterior muscle group at lower leg level, left leg, initial encounter: Secondary | ICD-10-CM

## 2012-11-16 NOTE — Patient Instructions (Addendum)
You have a calf strain Compression sleeve or ace wrap to help with swelling and pain. Icing for 15 minutes at a time 3-4 times a day Heel lifts either in temporary orthotics or on their own to prevent further strain - wear when up and walking around. Aleve 2 tabs twice a day with food OR ibuprofen 659m three times a day with food for pain and inflammation for 1-2 weeks then as needed. Heel raise exercise when able. Start ankle range of motion exercises (up/down and alphabet exercises). Add the theraband exercises as long as pain is 1-2 on a scale of 1-10. Lastly you would do calf raises (probably 2-3 weeks away from being able to do these - again pain 1-2 on a scale of 1-10). Cycling with low resistance or swimming for cross training in meantime. Expect about 4-6 weeks for you to be able to get back to running. Follow up with me around 1 month for reevaluation.

## 2012-11-20 ENCOUNTER — Encounter: Payer: Self-pay | Admitting: Family Medicine

## 2012-11-20 DIAGNOSIS — S86119A Strain of other muscle(s) and tendon(s) of posterior muscle group at lower leg level, unspecified leg, initial encounter: Secondary | ICD-10-CM | POA: Insufficient documentation

## 2012-11-20 NOTE — Assessment & Plan Note (Signed)
Left calf strain - history of pain coming on with exercise along with lack of tibial tenderness, palpable tenderness in medial gastroc consistent with calf strain.  Start with compression sleeve, home exercise program (start with seated exercises and will advance to calf raises as he improves).  Heel lifts, nsaids/tylenol for pain.  Can restart cycling with low resistance, swimming as pain improves.  F/u in 1 month.

## 2012-11-20 NOTE — Progress Notes (Signed)
Patient ID: Frank Larson, male   DOB: [birth year], 62 y.o.   MRN: [id number]  PCP: Georgetta Haber, MD  Subjective:   HPI: Patient is a 62 y.o. male here for left calf pain.  Patient is very active - running, swimming, stationary bike, elliptical. States approximately 9 days ago on 8/12 he noticed pain at base of calf (points to musculotendinous junction) while exercising. Rested a little bit, was able to do calf raises without pain. Then ran 3 miles a couple days later and pain intensified in this area. Has tried cross training with the above and has pain with these as well. Not doing any specific rehab. Some swelling but no bruising. No prior calf issues.  Past Medical History  Diagnosis Date  . Allergy   . Hyperlipidemia   . Diverticulosis   . GERD (gastroesophageal reflux disease)   . Arthritis   . Colitis     Current Outpatient Prescriptions on File Prior to Visit  Medication Sig Dispense Refill  . atorvastatin (LIPITOR) 10 MG tablet TAKE 1 TABLET BY MOUTH ONCE DAILY  90 tablet  PRN  . budesonide (ENTOCORT EC) 3 MG 24 hr capsule Take 3 capsules (9 mg total) by mouth daily.  270 capsule  1  . diphenoxylate-atropine (LOMOTIL) 2.5-0.025 MG per tablet TAKE 1 TABLET BY MOUTH FOUR TIMES DAILY  360 tablet  0  . cyanocobalamin (,VITAMIN B-12,) 1000 MCG/ML injection INJECT 1ML ONCE MONTHLY  10 mL  1  . fluticasone (FLONASE) 50 MCG/ACT nasal spray USE 2 SPRAYS IN EACH NOSTRIL ONCE DAILY  48 g  3  . NEEDLE, DISP, 25 G (B-D DISP NEEDLE 25GX1") 25G X 1" MISC 1 Syringe by Does not apply route every 30 (thirty) days.  6 each  1   No current facility-administered medications on file prior to visit.    Past Surgical History  Procedure Laterality Date  . Colonoscopy  08/07/2002  . Inguinal hernia repair    . Nasal sinus surgery    . Nose surgery      broken nose    No Known Allergies  History   Social History  . Marital Status: Married    Spouse Name: N/A    Number of  Children: 1  . Years of Education: N/A   Occupational History  . psychologist    Social History Main Topics  . Smoking status: Never Smoker   . Smokeless tobacco: Never Used  . Alcohol Use: 2.4 oz/week    4 Glasses of wine per week     Comment: occasionally  . Drug Use: No  . Sexual Activity: Yes   Other Topics Concern  . Not on file   Social History Narrative   Daily caffeine----regular exercise             Family History  Problem Relation Age of Onset  . Colon cancer Neg Hx   . Diabetes Neg Hx   . Hyperlipidemia Father   . Heart disease Father   . Peptic Ulcer Disease Father   . Colitis Father   . Parkinson's disease Mother   . Peptic Ulcer Disease Mother     BP 156/85  Pulse 57  Ht 6' 1"  (1.854 m)  Wt 158 lb (71.668 kg)  BMI 20.85 kg/m2  Review of Systems: See HPI above.    Objective:  Physical Exam:  Gen: NAD  L lower leg: No gross deformity, swelling, bruising, palpable cords. No TTP tibia, fibula.  Mild TTP medial gastroc  at and above musculotendinous junction.  No achilles, other lower leg - including ankle, knee - tenderness. FROM ankle and knee with 5/5 strength, no pain while seated. Pain reproduced with calf raise. Pain also in calf with hop test. Negative fulcrum.    Assessment & Plan:  1. Left calf strain - history of pain coming on with exercise along with lack of tibial tenderness, palpable tenderness in medial gastroc consistent with calf strain.  Start with compression sleeve, home exercise program (start with seated exercises and will advance to calf raises as he improves).  Heel lifts, nsaids/tylenol for pain.  Can restart cycling with low resistance, swimming as pain improves.  F/u in 1 month.

## 2012-11-22 ENCOUNTER — Ambulatory Visit: Payer: 59 | Admitting: Internal Medicine

## 2012-11-30 ENCOUNTER — Other Ambulatory Visit (INDEPENDENT_AMBULATORY_CARE_PROVIDER_SITE_OTHER): Payer: 59

## 2012-11-30 DIAGNOSIS — Z0000 Encounter for general adult medical examination without abnormal findings: Secondary | ICD-10-CM

## 2012-11-30 LAB — BASIC METABOLIC PANEL
GFR: 97.17 mL/min (ref >60.00)
Potassium: 3.9 mEq/L (ref 3.5–5.1)
Sodium: 138 mEq/L (ref 135–145)

## 2012-11-30 LAB — LIPID PANEL
Cholesterol: 175 mg/dL (ref 0–200)
HDL: 69.1 mg/dL (ref >39.00)
LDL Cholesterol: 95 mg/dL (ref 0–99)
Triglycerides: 54 mg/dL (ref 0.0–149.0)
VLDL: 10.8 mg/dL (ref 0.0–40.0)

## 2012-11-30 LAB — POCT URINALYSIS DIPSTICK
Bilirubin, UA: NEGATIVE
Glucose, UA: NEGATIVE
Ketones, UA: NEGATIVE
Spec Grav, UA: 1.025

## 2012-11-30 LAB — HEPATIC FUNCTION PANEL
AST: 19 U/L (ref 0–37)
Albumin: 3.7 g/dL (ref 3.5–5.2)
Alkaline Phosphatase: 37 U/L — ABNORMAL LOW (ref 39–117)
Total Bilirubin: 1.6 mg/dL — ABNORMAL HIGH (ref 0.3–1.2)

## 2012-11-30 LAB — CBC WITH DIFFERENTIAL/PLATELET
Basophils Absolute: 0 10*3/uL (ref 0.0–0.1)
Hemoglobin: 13.6 g/dL (ref 13.0–17.0)
Lymphocytes Relative: 37.5 % (ref 12.0–46.0)
Monocytes Relative: 13.6 % — ABNORMAL HIGH (ref 3.0–12.0)
Neutro Abs: 2.2 10*3/uL (ref 1.4–7.7)
RBC: 4.35 Mil/uL (ref 4.22–5.81)
RDW: 13.1 % (ref 11.5–14.6)

## 2012-12-05 ENCOUNTER — Encounter: Payer: Self-pay | Admitting: Internal Medicine

## 2012-12-05 ENCOUNTER — Ambulatory Visit (INDEPENDENT_AMBULATORY_CARE_PROVIDER_SITE_OTHER): Payer: 59 | Admitting: Internal Medicine

## 2012-12-05 VITALS — BP 140/80 | HR 60 | Ht 72.5 in | Wt 154.4 lb

## 2012-12-05 DIAGNOSIS — K52832 Lymphocytic colitis: Secondary | ICD-10-CM

## 2012-12-05 DIAGNOSIS — K5289 Other specified noninfective gastroenteritis and colitis: Secondary | ICD-10-CM

## 2012-12-05 MED ORDER — ZOLPIDEM TARTRATE 5 MG PO TABS: 5.0000 mg | ORAL_TABLET | Freq: Every evening | ORAL | Status: DC | PRN

## 2012-12-05 MED ORDER — BUDESONIDE 3 MG PO CP24: 9.0000 mg | ORAL_CAPSULE | Freq: Every day | ORAL | Status: DC

## 2012-12-05 MED ORDER — DIPHENOXYLATE-ATROPINE 2.5-0.025 MG PO TABS: 1.0000 | ORAL_TABLET | Freq: Four times a day (QID) | ORAL | Status: DC | PRN

## 2012-12-05 NOTE — Patient Instructions (Addendum)
We have sent the following medications to your pharmacy for you to pick up at your convenience:  Entocort, Lomotil, Ambien

## 2012-12-05 NOTE — Progress Notes (Signed)
HISTORY OF PRESENT ILLNESS:  Frank Larson is a 62 y.o. male with hyperlipidemia who is followed in this office for biopsy proven lymphocytic colitis (initially diagnosed 08/07/2002, repeat biopsies with most recent colonoscopy 07/28/2012 revealing the same.). He presents today for followup. As mentioned, complete colonoscopy 4 months ago. Moderate left-sided diverticulosis. Otherwise normal colon and ileum. Biopsies revealing lymphocytic colitis. Previous workup for celiac disease has been negative. He has been on Entocort 6 mg daily. He has been using Lomotil 2 twice a day. Overall he reports that he had a good summer. Generally 1-2 bowel movements in the morning and occasionally 1 in the evening. However, stools of the loose in recent days and he actually had an incontinence episode last evening. No other complaints. He does request medication refill. Also requests limited Ambien for a broad air travel.   REVIEW OF SYSTEMS:  All non-GI ROS negative except for sleeping problems, thin skin  Past Medical History  Diagnosis Date  . Allergy   . Hyperlipidemia   . Diverticulosis   . GERD (gastroesophageal reflux disease)   . Arthritis   . Colitis     Past Surgical History  Procedure Laterality Date  . Colonoscopy  08/07/2002  . Inguinal hernia repair    . Nasal sinus surgery    . Nose surgery      broken nose    Social History Frank Larson  reports that he has never smoked. He has never used smokeless tobacco. He reports that he drinks about 2.4 ounces of alcohol per week. He reports that he does not use illicit drugs.  family history includes Colitis in his father; Heart disease in his father; Hyperlipidemia in his father; Parkinson's disease in his mother; Peptic Ulcer Disease in his father and mother. There is no history of Colon cancer or Diabetes.  No Known Allergies     PHYSICAL EXAMINATION: Vital signs: BP 140/80  Pulse 60  Ht 6' 0.5" (1.842 m)  Wt 154 lb 6 oz (70.024 kg)   BMI 20.64 kg/m2 General: Well-developed, well-nourished, no acute distress HEENT: Sclerae are anicteric, conjunctiva pink. Oral mucosa intact Lungs: Clear Heart: Regular Abdomen: soft, nontender, nondistended, no obvious ascites, no peritoneal signs, normal bowel sounds. No organomegaly. Extremities: No edema Psychiatric: alert and oriented x3. Cooperative   ASSESSMENT:  #1. Lymphocytic colitis. Had been doing well on Entocort 6 mg daily and regular Lomotil. May be having a mild exacerbation #2. Incidental diverticulosis   PLAN:  #1. Continue Entocort. Might increased 9 mg daily short-term for recent flare. Then titrate lowest dose to control symptoms. Prescription refilled #2. Continue Lomotil as needed. Prescription refilled #3. Prescribe Ambien 5 mg by mouth each bedtime when necessary. #10. No refills #4. GI followup in 1 year #5. Surveillance colonoscopy around May 2024

## 2012-12-08 ENCOUNTER — Encounter: Payer: 59 | Admitting: Internal Medicine

## 2012-12-11 ENCOUNTER — Other Ambulatory Visit: Payer: Self-pay | Admitting: Internal Medicine

## 2012-12-20 ENCOUNTER — Encounter: Payer: 59 | Admitting: Internal Medicine

## 2012-12-21 ENCOUNTER — Encounter: Payer: 59 | Admitting: Internal Medicine

## 2012-12-25 ENCOUNTER — Other Ambulatory Visit: Payer: Self-pay | Admitting: Internal Medicine

## 2012-12-25 ENCOUNTER — Encounter: Payer: Self-pay | Admitting: Internal Medicine

## 2012-12-25 ENCOUNTER — Ambulatory Visit (INDEPENDENT_AMBULATORY_CARE_PROVIDER_SITE_OTHER): Payer: 59 | Admitting: Internal Medicine

## 2012-12-25 VITALS — BP 130/78 | HR 72 | Temp 98.2°F | Resp 16 | Ht 72.5 in | Wt 156.0 lb

## 2012-12-25 DIAGNOSIS — E785 Hyperlipidemia, unspecified: Secondary | ICD-10-CM

## 2012-12-25 DIAGNOSIS — Z23 Encounter for immunization: Secondary | ICD-10-CM

## 2012-12-25 MED ORDER — CYANOCOBALAMIN 1000 MCG/ML IJ SOLN: INTRAMUSCULAR | Status: DC

## 2012-12-25 NOTE — Patient Instructions (Signed)
The patient is instructed to continue all medications as prescribed. Schedule followup with check out clerk upon leaving the clinic  

## 2012-12-25 NOTE — Addendum Note (Signed)
Addended by: Allyne Gee on: 12/25/2012 05:07 PM   Modules accepted: Orders

## 2012-12-25 NOTE — Progress Notes (Signed)
Subjective:    Patient ID: Frank Larson, male    DOB: 1951-03-04, 62 y.o.   MRN: [id number]  HPIpatient presents for annual examination without complaints his family history of cardiovascular disease is on Lipitor for cholesterol otherwise primarily he has orthopedic issues from long-standing exercising running    Review of Systems  Constitutional: Negative for fever and fatigue.  HENT: Negative for hearing loss, congestion, neck pain and postnasal drip.   Eyes: Negative for discharge, redness and visual disturbance.  Respiratory: Negative for cough, shortness of breath and wheezing.   Cardiovascular: Negative for leg swelling.  Gastrointestinal: Negative for abdominal pain, constipation and abdominal distention.  Genitourinary: Negative for urgency and frequency.  Musculoskeletal: Negative for joint swelling and arthralgias.  Skin: Negative for color change and rash.  Neurological: Negative for weakness and light-headedness.  Hematological: Negative for adenopathy.  Psychiatric/Behavioral: Negative for behavioral problems.       Past Medical History  Diagnosis Date  . Allergy   . Hyperlipidemia   . Diverticulosis   . GERD (gastroesophageal reflux disease)   . Arthritis   . Colitis     History   Social History  . Marital Status: Married    Spouse Name: N/A    Number of Children: 1  . Years of Education: N/A   Occupational History  . psychologist    Social History Main Topics  . Smoking status: Never Smoker   . Smokeless tobacco: Never Used  . Alcohol Use: 2.4 oz/week    4 Glasses of wine per week     Comment: occasionally  . Drug Use: No  . Sexual Activity: Yes   Other Topics Concern  . Not on file   Social History Narrative   Daily caffeine----regular exercise             Past Surgical History  Procedure Laterality Date  . Colonoscopy  08/07/2002  . Inguinal hernia repair    . Nasal sinus surgery    . Nose surgery      broken nose    Family  History  Problem Relation Age of Onset  . Colon cancer Neg Hx   . Diabetes Neg Hx   . Hyperlipidemia Father   . Heart disease Father   . Peptic Ulcer Disease Father   . Colitis Father   . Parkinson's disease Mother   . Peptic Ulcer Disease Mother     No Known Allergies  Current Outpatient Prescriptions on File Prior to Visit  Medication Sig Dispense Refill  . atorvastatin (LIPITOR) 10 MG tablet TAKE 1 TABLET BY MOUTH ONCE DAILY  90 tablet  97  . budesonide (ENTOCORT EC) 3 MG 24 hr capsule Take 3 capsules (9 mg total) by mouth daily.  270 capsule  2  . cyanocobalamin (,VITAMIN B-12,) 1000 MCG/ML injection INJECT 1ML ONCE MONTHLY  10 mL  1  . diphenoxylate-atropine (LOMOTIL) 2.5-0.025 MG per tablet Take 1 tablet by mouth 4 (four) times daily as needed for diarrhea or loose stools.  360 tablet  1  . NEEDLE, DISP, 25 G (B-D DISP NEEDLE 25GX1") 25G X 1" MISC 1 Syringe by Does not apply route every 30 (thirty) days.  6 each  1  . zolpidem (AMBIEN) 5 MG tablet Take 1 tablet (5 mg total) by mouth at bedtime as needed for sleep.  10 tablet  0   No current facility-administered medications on file prior to visit.    BP 130/78  Pulse 72  Temp(Src) 98.2 F (  36.8 C)  Resp 16  Ht 6' 0.5" (1.842 m)  Wt 156 lb (70.761 kg)  BMI 20.86 kg/m2    Objective:   Physical Exam  Nursing note reviewed. Constitutional: He is oriented to person, place, and time. He appears well-developed and well-nourished.  HENT:  Head: Normocephalic and atraumatic.  Eyes: Conjunctivae are normal. Pupils are equal, round, and reactive to light.  Neck: Normal range of motion. Neck supple.  Cardiovascular: Normal rate and regular rhythm.   Pulmonary/Chest: Effort normal and breath sounds normal.  Abdominal: Soft. Bowel sounds are normal.  Genitourinary: Rectum normal and prostate normal.  Musculoskeletal: Normal range of motion.  Neurological: He is alert and oriented to person, place, and time.  Skin: Skin is  warm and dry.  Psychiatric: He has a normal mood and affect. His behavior is normal.          Assessment & Plan:  / Patient presents for yearly preventative medicine examination.   all immunizations and health maintenance protocols were reviewed with the patient and they are up to date with these protocols.   screening laboratory values were reviewed with the patient including screening of hyperlipidemia PSA renal function and hepatic function.   There medications past medical history social history problem list and allergies were reviewed in detail.   Goals were established with regard to weight loss exercise diet in compliance with medications

## 2013-01-24 ENCOUNTER — Ambulatory Visit (INDEPENDENT_AMBULATORY_CARE_PROVIDER_SITE_OTHER): Payer: 59 | Admitting: *Deleted

## 2013-01-24 DIAGNOSIS — Z2911 Encounter for prophylactic immunotherapy for respiratory syncytial virus (RSV): Secondary | ICD-10-CM

## 2013-01-24 DIAGNOSIS — Z23 Encounter for immunization: Secondary | ICD-10-CM

## 2013-03-19 ENCOUNTER — Ambulatory Visit (INDEPENDENT_AMBULATORY_CARE_PROVIDER_SITE_OTHER): Payer: 59 | Admitting: Family Medicine

## 2013-03-19 ENCOUNTER — Ambulatory Visit
Admission: RE | Admit: 2013-03-19 | Discharge: 2013-03-19 | Disposition: A | Discharge status: Home / Self Care | Payer: 59 | Source: Ambulatory Visit | Attending: Family Medicine | Admitting: Family Medicine

## 2013-03-19 VITALS — BP 130/74 | Temp 97.8°F | Wt 158.0 lb

## 2013-03-19 DIAGNOSIS — J329 Chronic sinusitis, unspecified: Secondary | ICD-10-CM

## 2013-03-19 DIAGNOSIS — E0789 Other specified disorders of thyroid: Secondary | ICD-10-CM

## 2013-03-19 DIAGNOSIS — J309 Allergic rhinitis, unspecified: Secondary | ICD-10-CM

## 2013-03-19 LAB — T3, FREE: T3, Free: 3 pg/mL (ref 2.3–4.2)

## 2013-03-19 NOTE — Progress Notes (Addendum)
Chief Complaint  Patient presents with  . thyroid concern    bottom of face swollen and tender, sore throat x 1 month     HPI:  [name] is a 62 yo M pt of Dr. Arnoldo Morale here for an acute visit for: -started about 1 month ago -sore throat - he thought was sinus infection, lots of drainage, low energy which always is his indicator of a sinus infection, cough -last few days feels like glands in neck a little swollen and wonders if thyroid -has hx of allergies and recurrent sinus infections -used to be on shots for allergies but stopped -use to take nasal steroid and needs refill -has hx of sinus surgery -denies: fevers, chills, CP, SOB, lip or tongue welling, hoarseness, constipation, heart racing, skin changes, nausea, breathing problems - swam 1 mile yesterday  ROS: See pertinent positives and negatives per HPI.  Past Medical History  Diagnosis Date  . Allergy   . Hyperlipidemia   . Diverticulosis   . GERD (gastroesophageal reflux disease)   . Arthritis   . Colitis     Past Surgical History  Procedure Laterality Date  . Colonoscopy  08/07/2002  . Inguinal hernia repair    . Nasal sinus surgery    . Nose surgery      broken nose    Family History  Problem Relation Age of Onset  . Colon cancer Neg Hx   . Diabetes Neg Hx   . Hyperlipidemia Father   . Heart disease Father   . Peptic Ulcer Disease Father   . Colitis Father   . Parkinson's disease Mother   . Peptic Ulcer Disease Mother     History   Social History  . Marital Status: Married    Spouse Name: N/A    Number of Children: 1  . Years of Education: N/A   Occupational History  . psychologist    Social History Main Topics  . Smoking status: Never Smoker   . Smokeless tobacco: Never Used  . Alcohol Use: 2.4 oz/week    4 Glasses of wine per week     Comment: occasionally  . Drug Use: No  . Sexual Activity: Yes   Other Topics Concern  . Not on file   Social History Narrative   Daily  caffeine----regular exercise             Current outpatient prescriptions:atorvastatin (LIPITOR) 10 MG tablet, TAKE 1 TABLET BY MOUTH ONCE DAILY, Disp: 90 tablet, Rfl: 97;  B-D 3CC LUER-LOK SYR 25GX1" 25G X 1" 3 ML MISC, USE AS DIRECTED, Disp: 10 each, Rfl: 1;  budesonide (ENTOCORT EC) 3 MG 24 hr capsule, Take 3 capsules (9 mg total) by mouth daily., Disp: 270 capsule, Rfl: 2;  cyanocobalamin (,VITAMIN B-12,) 1000 MCG/ML injection, INJECT 1ML ONCE MONTHLY, Disp: 10 mL, Rfl: 1 diphenoxylate-atropine (LOMOTIL) 2.5-0.025 MG per tablet, Take 1 tablet by mouth 4 (four) times daily as needed for diarrhea or loose stools., Disp: 360 tablet, Rfl: 1;  NEEDLE, DISP, 25 G (B-D DISP NEEDLE 25GX1") 25G X 1" MISC, 1 Syringe by Does not apply route every 30 (thirty) days., Disp: 6 each, Rfl: 1;  zolpidem (AMBIEN) 5 MG tablet, Take 1 tablet (5 mg total) by mouth at bedtime as needed for sleep., Disp: 10 tablet, Rfl: 0  EXAM:  Filed Vitals:   03/19/13 0954  BP: 130/74  Temp: 97.8 F (36.6 C)    Body mass index is 21.12 kg/(m^2).  GENERAL: vitals reviewed and  listed above, alert, oriented, appears well hydrated and in no acute distress  HEENT: atraumatic, conjunttiva clear, no obvious abnormalities on inspection of external nose and ears, normal appearance of ear canals and TMs, clear nasal congestion, mild post oropharyngeal erythema with PND, no tonsillar edema or exudate, no sinus TTP  NECK: few shotty ant cervical lymph nodes, thyroid poaiably mildly enlarged and tender  LUNGS: clear to auscultation bilaterally, no wheezes, rales or rhonchi, good air movement  CV: HRRR, no peripheral edema  MS: moves all extremities without noticeable abnormality  PSYCH: pleasant and cooperative, no obvious depression or anxiety  ASSESSMENT AND PLAN:  Discussed the following assessment and plan:  ALLERGIC RHINITIS  SINUSITIS, CHRONIC  Thyroid pain - Plan: TSH, T4, Free, T3, Free, US Soft Tissue  Head/Neck  -has known chronic sinus issues and refuses abx or referral for this - emergency percautions, refilled INS -thyroid possibly mildly enlarged and mildly tender - given his concern will get [place] and thyroid labs to eval for thyroiditis - seems less likely infectious -Patient advised to return or notify a doctor immediately if symptoms worsen or persist or new concerns arise.  There are no Patient Instructions on file for this visit.   Frank Larson R.

## 2013-03-19 NOTE — Progress Notes (Signed)
Pre visit review using our clinic review tool, if applicable. No additional management support is needed unless otherwise documented below in the visit note. 

## 2013-04-26 ENCOUNTER — Ambulatory Visit: Payer: 59 | Admitting: Sports Medicine

## 2013-05-29 ENCOUNTER — Encounter: Payer: Self-pay | Admitting: Sports Medicine

## 2013-05-29 ENCOUNTER — Ambulatory Visit
Admission: RE | Admit: 2013-05-29 | Discharge: 2013-05-29 | Disposition: A | Discharge status: Home / Self Care | Payer: 59 | Source: Ambulatory Visit | Attending: Sports Medicine | Admitting: Sports Medicine

## 2013-05-29 ENCOUNTER — Ambulatory Visit (INDEPENDENT_AMBULATORY_CARE_PROVIDER_SITE_OTHER): Payer: 59 | Admitting: Sports Medicine

## 2013-05-29 VITALS — BP 144/92 | HR 60 | Ht 72.5 in | Wt 158.0 lb

## 2013-05-29 DIAGNOSIS — M25529 Pain in unspecified elbow: Secondary | ICD-10-CM

## 2013-05-29 DIAGNOSIS — M25522 Pain in left elbow: Secondary | ICD-10-CM

## 2013-05-29 NOTE — Progress Notes (Signed)
Patient ID: DEMETRI GOSHERT, male   DOB: [birth year], 63 y.o.   MRN: [id number]  Patient has a history of left elbow fractures of the radial head x2 In January 2004 came he injure the medial aspect of his left elbow while doing inward flies Since that time he has had intermittent pain in the left medial elbow that feels deep. It hurts with certain lifts It also hurts with the pool phase of the swimming stroke on the left Certain biceps activities hurt   Leaning and putting too much weight on the elbow hurt He gradually has cut down her swimming and certain other activities because of the persistent pain and thinks he sees some small amount of swelling  Exam  No acute distress  Left elbow reveals no palpable pain over the medial or lateral epicondyle There is some palpable pain and a small amount of swelling deep along the medial aspect of the flexor surface of the elbow joint This is made somewhat worse with resisted biceps flexion Pain increases somewhat with resisted pronation and less so with resisted supintation He lacks about 5-10 full extension Right elbow has full extension  [facility] ultrasound  Flexor and extensor tendons appear intact There appears to be a spur along the medial aspect of the joint line of the elbow with some loss of cartilage along that portion of the joint Radial head appears intact but there is a loose body noted in the joint with mild swelling best visualized on the lateral side Quadriceps tendon and other tendons appear intact

## 2013-05-29 NOTE — Assessment & Plan Note (Signed)
Patient has a history of radial head fracture left x2. Has a history of right elbow pain that was lateral epicondylitis in 2009. Current bout of elbow pain came on without specific injury. He was doing weightlifting and swimming. [facility] ultrasound this is suspicious for spurring and loss of cartilage along the medial elbow joint line I suspect that he may have some degenerative changes that are residual from his old injuries  Use compression sleeve Light weight work for his biceps curls and forearm rolls  X-ray today  Followup in 2 months

## 2013-05-30 ENCOUNTER — Other Ambulatory Visit: Payer: Self-pay | Admitting: Internal Medicine

## 2013-06-05 ENCOUNTER — Ambulatory Visit: Payer: 59 | Admitting: Sports Medicine

## 2013-07-03 ENCOUNTER — Other Ambulatory Visit: Payer: Self-pay | Admitting: Internal Medicine

## 2013-08-03 ENCOUNTER — Other Ambulatory Visit: Payer: Self-pay | Admitting: Internal Medicine

## 2013-08-03 NOTE — Telephone Encounter (Signed)
Okay to refill. Only 10 pills without refills. Thanks

## 2013-08-03 NOTE — Telephone Encounter (Signed)
Pt states he would like Dr Arnoldo Morale to refill this rx zolpidem (AMBIEN) 5 MG tablet due to dr Henrene Pastor office not responding.  Dr Henrene Pastor filled this the last time in 2014.  Pt is going out of town Mon, 4:30 am andwould like rx today sent to [facility] pharm. pls advise

## 2013-09-03 ENCOUNTER — Telehealth: Payer: Self-pay | Admitting: Internal Medicine

## 2013-09-03 NOTE — Telephone Encounter (Signed)
Patient Information:  Caller Name: Dell  Phone: [phone]  Patient: Frank, Larson  Gender: Male  DOB: 01-02-1951  Age: 63 Years  PCP: Benay Pillow (Adults only, leaving end of July 2015)  Office Follow Up:  Does the office need to follow up with this patient?: No  Instructions For The Office: N/A  RN Note:  Pt feels he has bilateral inguinal hernias after lifting heavy articles. He has an hx of hernia repair in the past. Having mild lower abdominal pain. Abd is soft.  Symptoms  Reason For Call & Symptoms: ? inguinal hernia  Reviewed Health History In EMR: Yes  Reviewed Medications In EMR: Yes  Reviewed Allergies In EMR: Yes  Reviewed Surgeries / Procedures: Yes  Date of Onset of Symptoms: 08/25/2013  Guideline(s) Used:  Abdominal Pain - Male  Disposition Per Guideline:   See Today in Office  Reason For Disposition Reached:   Patient wants to be seen  Advice Given:  No more heavy lifting. If pain increases call back.   RN Overrode Recommendation:  Make Appointment  He prefers an AM appt.  Appointment Scheduled:  09/04/2013 15:15:00 Appointment Scheduled Provider:  Shanon Ace [facility])

## 2013-09-03 NOTE — Telephone Encounter (Signed)
Noted  

## 2013-09-04 ENCOUNTER — Ambulatory Visit (INDEPENDENT_AMBULATORY_CARE_PROVIDER_SITE_OTHER): Payer: 59 | Admitting: Internal Medicine

## 2013-09-04 ENCOUNTER — Encounter: Payer: Self-pay | Admitting: Internal Medicine

## 2013-09-04 ENCOUNTER — Telehealth: Payer: Self-pay | Admitting: Family Medicine

## 2013-09-04 VITALS — BP 140/80 | Temp 98.3°F | Wt 155.0 lb

## 2013-09-04 DIAGNOSIS — R109 Unspecified abdominal pain: Secondary | ICD-10-CM

## 2013-09-04 DIAGNOSIS — R1031 Right lower quadrant pain: Secondary | ICD-10-CM | POA: Insufficient documentation

## 2013-09-04 DIAGNOSIS — R1032 Left lower quadrant pain: Principal | ICD-10-CM

## 2013-09-04 NOTE — Progress Notes (Signed)
Chief Complaint  Patient presents with  . Abdominal Pain    Started over [place] Day weekend.  Patient was lifting heavy objects.    HPI: Patient comes in today for SDA for  new problem evaluation.PCP NA Remote history of left inguinal hernia repair in his 57s. He is very physically active in no Memorial Day weekend May 22 he was doing some heavy lifting to his of transporting materials to vacation home. No specific injury but the next day was sore in the areas where his previous inguinal hernia was and also on the right side. No other specific injury bulging worsening with cough or strain. He does lots of exercises including walking elliptical working out and did not back off on this. This continued to be sore without associated symptoms nausea vomiting fever or radiation but comes in to get checked out because of its persistence and his spouse recommended he be checked.  ROS: See pertinent positives and negatives per HPI.no cp sob gu si csx changes  Past Surgical History  Procedure Laterality Date  . Colonoscopy  08/07/2002  . Inguinal hernia repair    . Nasal sinus surgery    . Nose surgery      broken nose    Past Medical History  Diagnosis Date  . Allergy   . Hyperlipidemia   . Diverticulosis   . GERD (gastroesophageal reflux disease)   . Arthritis   . Colitis     Family History  Problem Relation Age of Onset  . Colon cancer Neg Hx   . Diabetes Neg Hx   . Hyperlipidemia Father   . Heart disease Father   . Peptic Ulcer Disease Father   . Colitis Father   . Parkinson's disease Mother   . Peptic Ulcer Disease Mother     History   Social History  . Marital Status: Married    Spouse Name: N/A    Number of Children: 1  . Years of Education: N/A   Occupational History  . psychologist    Social History Main Topics  . Smoking status: Never Smoker   . Smokeless tobacco: Never Used  . Alcohol Use: 2.4 oz/week    4 Glasses of wine per week     Comment:  occasionally  . Drug Use: No  . Sexual Activity: Yes   Other Topics Concern  . None   Social History Narrative   Daily caffeine----regular exercise             Outpatient Encounter Prescriptions as of 09/04/2013  Medication Sig  . atorvastatin (LIPITOR) 10 MG tablet TAKE 1 TABLET BY MOUTH ONCE DAILY  . B-D 3CC LUER-LOK SYR 25GX1" 25G X 1" 3 ML MISC USE AS DIRECTED  . budesonide (ENTOCORT EC) 3 MG 24 hr capsule Take 3 capsules (9 mg total) by mouth daily.  . cyanocobalamin (,VITAMIN B-12,) 1000 MCG/ML injection INJECT 1ML ONCE MONTHLY  . diphenoxylate-atropine (LOMOTIL) 2.5-0.025 MG per tablet TAKE 1 TABLET BY MOUTH FOUR TIMES DAILY AS NEEDED FOR DIARRHEA OR LOOSE STOOLS.  . fluticasone (FLONASE) 50 MCG/ACT nasal spray USE 2 SPRAYS IN EACH NOSTRIL ONCE DAILY  . NEEDLE, DISP, 25 G (B-D DISP NEEDLE 25GX1") 25G X 1" MISC 1 Syringe by Does not apply route every 30 (thirty) days.  Marland Kitchen zolpidem (AMBIEN) 5 MG tablet TAKE 1 TABLET BY MOUTH AT BEDTIME AS NEEDED FOR SLEEP    EXAM:  BP 140/80  Temp(Src) 98.3 F (36.8 C) (Oral)  Wt 155 lb (70.308  kg)  Body mass index is 20.72 kg/(m^2).  GENERAL: vitals reviewed and listed above, alert, oriented, appears well hydrated and in no acute distress CV: HRRR, no clubbing cyanosis or  peripheral edema nl cap refill  Abdomen:  Sof,t normal bowel sounds without hepatosplenomegaly, no guarding rebound or masses no CVA tenderness standing and sitting  Area right supra inguinal are a tender no mass left scar no bulging  Neg mass inguinal ring on cough  MS: moves all extremities without noticeable focal  abnormality PSYCH: pleasant and cooperative,   ASSESSMENT AND PLAN:  Discussed the following assessment and plan:  Inguinal pain of both sides - hx of hernia onset day after lifiting  no obv henia on exam today but tenderness  rx as strain fu if bulging persistent  rehceck or surgery check if appropriate  -Patient advised to return or notify health  care team  if symptoms worsen ,persist or new concerns arise.  Patient Instructions  This could still be a strain  And  back off on lifting and abd work out  For a few weeks . If   persistent or progressive can do fu  If needed we can get   Surgery opinion but I dont feel a discrete hernia today .    Sometimes strain can take 6-weeks to feel better   Inguinal Hernia, Adult Muscles help keep everything in the body in its proper place. But if a weak spot in the muscles develops, something can poke through. That is called a hernia. When this happens in the lower part of the belly (abdomen), it is called an inguinal hernia. (It takes its name from a part of the body in this region called the inguinal canal.) A weak spot in the wall of muscles lets some fat or part of the small intestine bulge through. An inguinal hernia can develop at any age. Men get them more often than women. CAUSES  In adults, an inguinal hernia develops over time.  It can be triggered by:  Suddenly straining the muscles of the lower abdomen.  Lifting heavy objects.  Straining to have a bowel movement. Difficult bowel movements (constipation) can lead to this.  Constant coughing. This may be caused by smoking or lung disease.  Being overweight.  Being pregnant.  Working at a job that requires long periods of standing or heavy lifting.  Having had an inguinal hernia before. One type can be an emergency situation. It is called a strangulated inguinal hernia. It develops if part of the small intestine slips through the weak spot and cannot get back into the abdomen. The blood supply can be cut off. If that happens, part of the intestine may die. This situation requires emergency surgery. SYMPTOMS  Often, a small inguinal hernia has no symptoms. It is found when a healthcare provider does a physical exam. Larger hernias usually have symptoms.   In adults, symptoms may include:  A lump in the groin. This is easier to  see when the person is standing. It might disappear when lying down.  In men, a lump in the scrotum.  Pain or burning in the groin. This occurs especially when lifting, straining or coughing.  A dull ache or feeling of pressure in the groin.  Signs of a strangulated hernia can include:  A bulge in the groin that becomes very painful and tender to the touch.  A bulge that turns red or purple.  Fever, nausea and vomiting.  Inability to have a bowel  movement or to pass gas. DIAGNOSIS  To decide if you have an inguinal hernia, a healthcare provider will probably do a physical examination.  This will include asking questions about any symptoms you have noticed.  The healthcare provider might feel the groin area and ask you to cough. If an inguinal hernia is felt, the healthcare provider may try to slide it back into the abdomen.  Usually no other tests are needed. TREATMENT  Treatments can vary. The size of the hernia makes a difference. Options include:  Watchful waiting. This is often suggested if the hernia is small and you have had no symptoms.  No medical procedure will be done unless symptoms develop.  You will need to watch closely for symptoms. If any occur, contact your healthcare provider right away.  Surgery. This is used if the hernia is larger or you have symptoms.  Open surgery. This is usually an outpatient procedure (you will not stay overnight in a hospital). An cut (incision) is made through the skin in the groin. The hernia is put back inside the abdomen. The weak area in the muscles is then repaired by herniorrhaphy or hernioplasty. Herniorrhaphy: in this type of surgery, the weak muscles are sewn back together. Hernioplasty: a patch or mesh is used to close the weak area in the abdominal wall.  Laparoscopy. In this procedure, a surgeon makes small incisions. A thin tube with a tiny video camera (called a laparoscope) is put into the abdomen. The surgeon repairs  the hernia with mesh by looking with the video camera and using two long instruments. HOME CARE INSTRUCTIONS   After surgery to repair an inguinal hernia:  You will need to take pain medicine prescribed by your healthcare provider. Follow all directions carefully.  You will need to take care of the wound from the incision.  Your activity will be restricted for awhile. This will probably include no heavy lifting for several weeks. You also should not do anything too active for a few weeks. When you can return to work will depend on the type of job that you have.  During "watchful waiting" periods, you should:  Maintain a healthy weight.  Eat a diet high in fiber (fruits, vegetables and whole grains).  Drink plenty of fluids to avoid constipation. This means drinking enough water and other liquids to keep your urine clear or pale yellow.  Do not lift heavy objects.  Do not stand for long periods of time.  Quit smoking. This should keep you from developing a frequent cough. SEEK MEDICAL CARE IF:   A bulge develops in your groin area.  You feel pain, a burning sensation or pressure in the groin. This might be worse if you are lifting or straining.  You develop a fever of more than 100.5 F (38.1 C). SEEK IMMEDIATE MEDICAL CARE IF:   Pain in the groin increases suddenly.  A bulge in the groin gets bigger suddenly and does not go down.  For men, there is sudden pain in the scrotum. Or, the size of the scrotum increases.  A bulge in the groin area becomes red or purple and is painful to touch.  You have nausea or vomiting that does not go away.  You feel your heart beating much faster than normal.  You cannot have a bowel movement or pass gas.  You develop a fever of more than 102.0 F (38.9 C). Document Released: 08/01/2008 Document Revised: 06/07/2011 Document Reviewed: 08/01/2008 Exit[employer] Patient Information 2014 [employer]       Standley Brooking. Panosh M.D.  Pre  visit review using our clinic review tool, if applicable. No additional management support is needed unless otherwise documented below in the visit note.

## 2013-09-04 NOTE — Patient Instructions (Signed)
This could still be a strain  And  back off on lifting and abd work out  For a few weeks . If   persistent or progressive can do fu  If needed we can get   Surgery opinion but I dont feel a discrete hernia today .    Sometimes strain can take 6-weeks to feel better   Inguinal Hernia, Adult Muscles help keep everything in the body in its proper place. But if a weak spot in the muscles develops, something can poke through. That is called a hernia. When this happens in the lower part of the belly (abdomen), it is called an inguinal hernia. (It takes its name from a part of the body in this region called the inguinal canal.) A weak spot in the wall of muscles lets some fat or part of the small intestine bulge through. An inguinal hernia can develop at any age. Men get them more often than women. CAUSES  In adults, an inguinal hernia develops over time.  It can be triggered by:  Suddenly straining the muscles of the lower abdomen.  Lifting heavy objects.  Straining to have a bowel movement. Difficult bowel movements (constipation) can lead to this.  Constant coughing. This may be caused by smoking or lung disease.  Being overweight.  Being pregnant.  Working at a job that requires long periods of standing or heavy lifting.  Having had an inguinal hernia before. One type can be an emergency situation. It is called a strangulated inguinal hernia. It develops if part of the small intestine slips through the weak spot and cannot get back into the abdomen. The blood supply can be cut off. If that happens, part of the intestine may die. This situation requires emergency surgery. SYMPTOMS  Often, a small inguinal hernia has no symptoms. It is found when a healthcare provider does a physical exam. Larger hernias usually have symptoms.   In adults, symptoms may include:  A lump in the groin. This is easier to see when the person is standing. It might disappear when lying down.  In men, a lump in  the scrotum.  Pain or burning in the groin. This occurs especially when lifting, straining or coughing.  A dull ache or feeling of pressure in the groin.  Signs of a strangulated hernia can include:  A bulge in the groin that becomes very painful and tender to the touch.  A bulge that turns red or purple.  Fever, nausea and vomiting.  Inability to have a bowel movement or to pass gas. DIAGNOSIS  To decide if you have an inguinal hernia, a healthcare provider will probably do a physical examination.  This will include asking questions about any symptoms you have noticed.  The healthcare provider might feel the groin area and ask you to cough. If an inguinal hernia is felt, the healthcare provider may try to slide it back into the abdomen.  Usually no other tests are needed. TREATMENT  Treatments can vary. The size of the hernia makes a difference. Options include:  Watchful waiting. This is often suggested if the hernia is small and you have had no symptoms.  No medical procedure will be done unless symptoms develop.  You will need to watch closely for symptoms. If any occur, contact your healthcare provider right away.  Surgery. This is used if the hernia is larger or you have symptoms.  Open surgery. This is usually an outpatient procedure (you will not stay overnight in a  hospital). An cut (incision) is made through the skin in the groin. The hernia is put back inside the abdomen. The weak area in the muscles is then repaired by herniorrhaphy or hernioplasty. Herniorrhaphy: in this type of surgery, the weak muscles are sewn back together. Hernioplasty: a patch or mesh is used to close the weak area in the abdominal wall.  Laparoscopy. In this procedure, a surgeon makes small incisions. A thin tube with a tiny video camera (called a laparoscope) is put into the abdomen. The surgeon repairs the hernia with mesh by looking with the video camera and using two long instruments. HOME  CARE INSTRUCTIONS   After surgery to repair an inguinal hernia:  You will need to take pain medicine prescribed by your healthcare provider. Follow all directions carefully.  You will need to take care of the wound from the incision.  Your activity will be restricted for awhile. This will probably include no heavy lifting for several weeks. You also should not do anything too active for a few weeks. When you can return to work will depend on the type of job that you have.  During "watchful waiting" periods, you should:  Maintain a healthy weight.  Eat a diet high in fiber (fruits, vegetables and whole grains).  Drink plenty of fluids to avoid constipation. This means drinking enough water and other liquids to keep your urine clear or pale yellow.  Do not lift heavy objects.  Do not stand for long periods of time.  Quit smoking. This should keep you from developing a frequent cough. SEEK MEDICAL CARE IF:   A bulge develops in your groin area.  You feel pain, a burning sensation or pressure in the groin. This might be worse if you are lifting or straining.  You develop a fever of more than 100.5 F (38.1 C). SEEK IMMEDIATE MEDICAL CARE IF:   Pain in the groin increases suddenly.  A bulge in the groin gets bigger suddenly and does not go down.  For men, there is sudden pain in the scrotum. Or, the size of the scrotum increases.  A bulge in the groin area becomes red or purple and is painful to touch.  You have nausea or vomiting that does not go away.  You feel your heart beating much faster than normal.  You cannot have a bowel movement or pass gas.  You develop a fever of more than 102.0 F (38.9 C). Document Released: 08/01/2008 Document Revised: 06/07/2011 Document Reviewed: 08/01/2008 [facility] Patient Information 2014 [place], [place].

## 2013-09-04 NOTE — Telephone Encounter (Signed)
Patient is requesting refills of Ambien for travels through the year.  Requesting #[phone] to go to [facility].

## 2013-09-06 NOTE — Telephone Encounter (Signed)
Please advise if ok for this

## 2013-09-07 NOTE — Telephone Encounter (Signed)
May refill x 5

## 2013-09-11 MED ORDER — ZOLPIDEM TARTRATE 5 MG PO TABS: ORAL_TABLET | ORAL | Status: DC

## 2013-09-11 NOTE — Telephone Encounter (Signed)
rx called in

## 2013-10-04 ENCOUNTER — Ambulatory Visit (INDEPENDENT_AMBULATORY_CARE_PROVIDER_SITE_OTHER): Payer: 59 | Admitting: Family Medicine

## 2013-10-04 ENCOUNTER — Encounter: Payer: Self-pay | Admitting: Family Medicine

## 2013-10-04 ENCOUNTER — Telehealth: Payer: Self-pay | Admitting: Internal Medicine

## 2013-10-04 VITALS — BP 122/78 | HR 56 | Temp 98.1°F | Ht 72.5 in | Wt 156.0 lb

## 2013-10-04 DIAGNOSIS — R5381 Other malaise: Secondary | ICD-10-CM

## 2013-10-04 DIAGNOSIS — R5383 Other fatigue: Secondary | ICD-10-CM

## 2013-10-04 DIAGNOSIS — J309 Allergic rhinitis, unspecified: Secondary | ICD-10-CM

## 2013-10-04 DIAGNOSIS — R55 Syncope and collapse: Secondary | ICD-10-CM

## 2013-10-04 DIAGNOSIS — J329 Chronic sinusitis, unspecified: Secondary | ICD-10-CM

## 2013-10-04 LAB — BASIC METABOLIC PANEL
BUN: 14 mg/dL (ref 6–23)
CO2: 28 mEq/L (ref 19–32)
Calcium: 9.9 mg/dL (ref 8.4–10.5)
Chloride: 105 mEq/L (ref 96–112)
Creatinine, Ser: 1 mg/dL (ref 0.4–1.5)
GFR: 77.64 mL/min (ref >60.00)
Glucose, Bld: 83 mg/dL (ref 70–99)
POTASSIUM: 5 meq/L (ref 3.5–5.1)
SODIUM: 141 meq/L (ref 135–145)

## 2013-10-04 LAB — CBC WITH DIFFERENTIAL/PLATELET
Basophils Absolute: 0 10*3/uL (ref 0.0–0.1)
Basophils Relative: 0.5 % (ref 0.0–3.0)
Eosinophils Absolute: 0.2 10*3/uL (ref 0.0–0.7)
Eosinophils Relative: 2.9 % (ref 0.0–5.0)
HEMATOCRIT: 43 % (ref 39.0–52.0)
HEMOGLOBIN: 14.6 g/dL (ref 13.0–17.0)
LYMPHS ABS: 1.8 10*3/uL (ref 0.7–4.0)
Lymphocytes Relative: 31.9 % (ref 12.0–46.0)
MCHC: 33.9 g/dL (ref 30.0–36.0)
MCV: 92.5 fl (ref 78.0–100.0)
Monocytes Absolute: 0.8 10*3/uL (ref 0.1–1.0)
Monocytes Relative: 13.8 % — ABNORMAL HIGH (ref 3.0–12.0)
Neutro Abs: 2.9 10*3/uL (ref 1.4–7.7)
Neutrophils Relative %: 50.9 % (ref 43.0–77.0)
PLATELETS: 220 10*3/uL (ref 150.0–400.0)
RBC: 4.65 Mil/uL (ref 4.22–5.81)
RDW: 13.2 % (ref 11.5–15.5)
WBC: 5.6 10*3/uL (ref 4.0–10.5)

## 2013-10-04 LAB — VITAMIN B12: Vitamin B-12: 838 pg/mL (ref 211–911)

## 2013-10-04 NOTE — Progress Notes (Signed)
Pre visit review using our clinic review tool, if applicable. No additional management support is needed unless otherwise documented below in the visit note. 

## 2013-10-04 NOTE — Progress Notes (Addendum)
No chief complaint on file.   HPI:  Acute visit for:  1)sinus issues/fatigue: -on brief roc often complains of this, had thyroid labs/us recently normal -has chronic sinus issues and hx of sinus surgery and refuses antibiotic of ENT for this -reports a few weeks ago stood up suddenly and had brief syncope (reports no other symptoms at the time but thinks from his chronic diarrhea/orthostasis) -fatigue when wakes up in the morning for 1 week - reports has this chronically when has sinus issues -other symptoms: nasal congestion/pnd/cough -denies: fevers, HA, NVD, vision changes, SOB, CP, palpitations, weight loss, sinus pain -takes [place] when travels, b12 injections monthly -swims and exercises strenuously daily and ok when does this  ROS: See pertinent positives and negatives per HPI.  Past Medical History  Diagnosis Date  . Allergy   . Hyperlipidemia   . Diverticulosis   . GERD (gastroesophageal reflux disease)   . Arthritis   . Colitis     Past Surgical History  Procedure Laterality Date  . Colonoscopy  08/07/2002  . Inguinal hernia repair    . Nasal sinus surgery    . Nose surgery      broken nose    Family History  Problem Relation Age of Onset  . Colon cancer Neg Hx   . Diabetes Neg Hx   . Hyperlipidemia Father   . Heart disease Father   . Peptic Ulcer Disease Father   . Colitis Father   . Parkinson's disease Mother   . Peptic Ulcer Disease Mother     History   Social History  . Marital Status: Married    Spouse Name: N/A    Number of Children: 1  . Years of Education: N/A   Occupational History  . psychologist    Social History Main Topics  . Smoking status: Never Smoker   . Smokeless tobacco: Never Used  . Alcohol Use: 2.4 oz/week    4 Glasses of wine per week     Comment: occasionally  . Drug Use: No  . Sexual Activity: Yes   Other Topics Concern  . None   Social History Narrative   Daily caffeine----regular exercise              Current outpatient prescriptions:atorvastatin (LIPITOR) 10 MG tablet, TAKE 1 TABLET BY MOUTH ONCE DAILY, Disp: 90 tablet, Rfl: 97;  B-D 3CC LUER-LOK SYR 25GX1" 25G X 1" 3 ML MISC, USE AS DIRECTED, Disp: 10 each, Rfl: 1;  budesonide (ENTOCORT EC) 3 MG 24 hr capsule, Take 3 capsules (9 mg total) by mouth daily., Disp: 270 capsule, Rfl: 2;  cyanocobalamin (,VITAMIN B-12,) 1000 MCG/ML injection, INJECT 1ML ONCE MONTHLY, Disp: 10 mL, Rfl: 1 diphenoxylate-atropine (LOMOTIL) 2.5-0.025 MG per tablet, TAKE 1 TABLET BY MOUTH FOUR TIMES DAILY AS NEEDED FOR DIARRHEA OR LOOSE STOOLS., Disp: 360 tablet, Rfl: 1;  fluticasone (FLONASE) 50 MCG/ACT nasal spray, USE 2 SPRAYS IN EACH NOSTRIL ONCE DAILY, Disp: 48 g, Rfl: 3;  NEEDLE, DISP, 25 G (B-D DISP NEEDLE 25GX1") 25G X 1" MISC, 1 Syringe by Does not apply route every 30 (thirty) days., Disp: 6 each, Rfl: 1 zolpidem (AMBIEN) 5 MG tablet, TAKE 1 TABLET BY MOUTH AT BEDTIME AS NEEDED FOR SLEEP, Disp: 30 tablet, Rfl: 5  EXAM:  Filed Vitals:   10/04/13 0808  BP: 122/78  Pulse: 56  Temp: 98.1 F (36.7 C)    Body mass index is 20.86 kg/(m^2).  GENERAL: vitals reviewed and listed above, alert, oriented, appears well  hydrated and in no acute distress  HEENT: atraumatic, conjunttiva clear, no obvious abnormalities on inspection of external nose and ears, normal appearance of ear canals and TMs, clear nasal congestion, mild post oropharyngeal erythema with PND, no tonsillar edema or exudate, no sinus TTP  NECK: no obvious masses on inspection, no bruit  LUNGS: clear to auscultation bilaterally, no wheezes, rales or rhonchi, good air movement  CV: HRRR, no peripheral edema  MS: moves all extremities without noticeable abnormality  PSYCH: pleasant and cooperative, no obvious depression or anxiety  NEURO: gait normal, PERRLA  ASSESSMENT AND PLAN:  Discussed the following assessment and plan:  SINUSITIS, CHRONIC  ALLERGIC RHINITIS  Other malaise and  fatigue - Plan: Basic metabolic panel, CBC with Differential, Vitamin B12  Syncope, unspecified syncope type  -refuses eval for the episode of syncope as he feels this was nothing - advised he have evaluation with neurologist - normal neuro, carotid, pulm and heart exam today, discussed orthostasis - reports chronic known diarrhea and poor po intake and this is likely cause of this - sees GI for lymphocytic colitis -we discussed possible serious and likely etiologies, workup and treatment, treatment risks and return precautions -refuses treatment or further eval for what he reports is chronic sinusitis -agrees to basic labs for fatigue -advised to establish care with new provider and set up physical after npv in next 2 months  -Patient advised to return or notify a doctor immediately if symptoms worsen or persist or new concerns arise.  Patient Instructions  - we advise you to see the ear nose and throat doctor if sinus issues worsen or recur  - we advise you to see th neurologist for an evaluation of your episode of passing out - please call us for permission to place this referral  -We have ordered labs or studies at this visit. It can take up to 1-2 weeks for results and processing. We will contact you with instructions IF your results are abnormal. Normal results will be released to your [facility]. If you have not heard from [place] or can not find your results in [facility] in 2 weeks please contact our office.  -schedule a new patient visit with you new primary care doctor     Lucretia Kern.

## 2013-10-04 NOTE — Telephone Encounter (Signed)
[phone] (home) [phone] (work) LM to return call.

## 2013-10-04 NOTE — Telephone Encounter (Signed)
Pt was seen today and had bp checked a few times and would like to know the readings. Please call pt

## 2013-10-04 NOTE — Patient Instructions (Signed)
-   we advise you to see the ear nose and throat doctor if sinus issues worsen or recur  - we advise you to see th neurologist for an evaluation of your episode of passing out - please call us for permission to place this referral  -We have ordered labs or studies at this visit. It can take up to 1-2 weeks for results and processing. We will contact you with instructions IF your results are abnormal. Normal results will be released to your [facility]. If you have not heard from [place] or can not find your results in [facility] in 2 weeks please contact our office.  -schedule a new patient visit with you new primary care doctor

## 2013-10-05 ENCOUNTER — Telehealth: Payer: Self-pay | Admitting: *Deleted

## 2013-10-05 NOTE — Telephone Encounter (Signed)
I called the pt and advised him I am not able to print out a copy of the orthostatic BP results from his visit yesterday but I informed him per Dr Maudie Mercury the readings were 153/85 and 128/80 and the numbers did decrease when he stood up which does mean this is due to diarrhea and he needs to increase his fluids especially due to exercising and he agreed.  He also was advised Dr Maudie Mercury recommends to have his next physician whether it be Dr Yong Channel or not to repeat this when he is seen as well.

## 2013-10-09 ENCOUNTER — Telehealth: Payer: Self-pay | Admitting: Internal Medicine

## 2013-10-09 NOTE — Telephone Encounter (Signed)
Pt had to cancelled his appt on 10-15-13. Pt would like dr Arnoldo Morale to call him back personally

## 2013-10-12 NOTE — Telephone Encounter (Signed)
I am sure this is about who he should followup with and I would recommend that he establish with Dr. Yong Channel

## 2013-10-12 NOTE — Telephone Encounter (Signed)
Called and spoke with pt and pt is aware of recommendations.  Pt states he will call back if he decided to switch to Dr. Yong Channel.

## 2013-10-15 ENCOUNTER — Ambulatory Visit: Payer: 59 | Admitting: Internal Medicine

## 2014-01-01 ENCOUNTER — Other Ambulatory Visit: Payer: Self-pay | Admitting: Internal Medicine

## 2014-01-23 ENCOUNTER — Other Ambulatory Visit: Payer: Self-pay | Admitting: Internal Medicine

## 2014-01-28 ENCOUNTER — Ambulatory Visit (INDEPENDENT_AMBULATORY_CARE_PROVIDER_SITE_OTHER): Payer: 59 | Admitting: Family Medicine

## 2014-01-28 VITALS — BP 140/80 | HR 53 | Temp 98.2°F | Resp 16 | Ht 72.5 in | Wt 150.0 lb

## 2014-01-28 DIAGNOSIS — J0100 Acute maxillary sinusitis, unspecified: Secondary | ICD-10-CM

## 2014-01-28 MED ORDER — AMOXICILLIN-POT CLAVULANATE 875-125 MG PO TABS: 1.0000 | ORAL_TABLET | Freq: Two times a day (BID) | ORAL | Status: DC

## 2014-01-28 MED ORDER — FLUTICASONE PROPIONATE 50 MCG/ACT NA SUSP: 2.0000 | Freq: Every day | NASAL | Status: DC

## 2014-01-28 NOTE — Progress Notes (Signed)
Chief Complaint:  Chief Complaint  Patient presents with  . Headache    x 3 weeks   . Fatigue  . Sinusitis    HPI: Frank Larson is a 63 y.o. male who is here for  actue sinusitis x 3 weeks, history of it. HAs gpod luck with aumgentin. Travelin. Right ear stuffy. He had been oiut of flonase. He has chronic sinusitis and allergies. He has diverticular disease and also colitis, he has no hx of HTN . He is wary of taking abx, he states that when the sinus sxs get this far the flonase does not usually work He takes allergy meds. No fevers or chill. He is aggravated that he had to wait this long so his BP is slightly high, he felt he was bypassed for others during his wait to our walk in clinic, he has a flight to catch in 2 hrs to [place], [place] , he does executive life coaching, will be traveling there for 1 week, returning here and then he will be back again to [facility], [place]    Past Medical History  Diagnosis Date  . Allergy   . Hyperlipidemia   . Diverticulosis   . GERD (gastroesophageal reflux disease)   . Arthritis   . Colitis    Past Surgical History  Procedure Laterality Date  . Colonoscopy  08/07/2002  . Inguinal hernia repair    . Nasal sinus surgery    . Nose surgery      broken nose   History   Social History  . Marital Status: Married    Spouse Name: N/A    Number of Children: 1  . Years of Education: N/A   Occupational History  . psychologist    Social History Main Topics  . Smoking status: Never Smoker   . Smokeless tobacco: Never Used  . Alcohol Use: 2.4 oz/week    4 Glasses of wine per week     Comment: occasionally  . Drug Use: No  . Sexual Activity: Yes   Other Topics Concern  . None   Social History Narrative   Daily caffeine----regular exercise            Family History  Problem Relation Age of Onset  . Colon cancer Neg Hx   . Diabetes Neg Hx   . Hyperlipidemia Father   . Heart disease Father   . Peptic Ulcer Disease Father   . Colitis  Father   . Parkinson's disease Mother   . Peptic Ulcer Disease Mother    No Known Allergies Prior to Admission medications   Medication Sig Start Date End Date Taking? Authorizing Provider  atorvastatin (LIPITOR) 10 MG tablet TAKE 1 TABLET BY MOUTH ONCE DAILY 12/11/12  Yes Ricard Dillon, MD  B-D 3CC LUER-LOK SYR 25GX1" 25G X 1" 3 ML MISC USE AS DIRECTED 12/25/12  Yes Ricard Dillon, MD  budesonide (ENTOCORT EC) 3 MG 24 hr capsule TAKE 3 CAPSULES (9 MG TOTAL) BY MOUTH DAILY. 01/23/14  Yes Irene Shipper, MD  cyanocobalamin (,VITAMIN B-12,) 1000 MCG/ML injection INJECT 1ML ONCE MONTHLY 12/25/12  Yes Ricard Dillon, MD  diphenoxylate-atropine (LOMOTIL) 2.5-0.025 MG per tablet TAKE 1 TABLET BY MOUTH FOUR TIMES DAILY AS NEEDED FOR DIARRHEA OR LOOSE STOOLS. 01/01/14  Yes Irene Shipper, MD  fluticasone [facility]) 50 MCG/ACT nasal spray USE 2 SPRAYS IN EACH NOSTRIL ONCE DAILY 05/30/13  Yes Ricard Dillon, MD  NEEDLE, DISP, 25 G (B-D  DISP NEEDLE 25GX1") 25G X 1" MISC 1 Syringe by Does not apply route every 30 (thirty) days. 02/29/12  Yes Ricard Dillon, MD  zolpidem (AMBIEN) 5 MG tablet TAKE 1 TABLET BY MOUTH AT BEDTIME AS NEEDED FOR SLEEP 09/11/13  Yes Ricard Dillon, MD     ROS: The patient denies fevers, chills, night sweats, unintentional weight loss, chest pain, palpitations, wheezing, dyspnea on exertion, nausea, vomiting, abdominal pain, dysuria, hematuria, melena, numbness, weakness, or tingling.   All other systems have been reviewed and were otherwise negative with the exception of those mentioned in the HPI and as above.    PHYSICAL EXAM: Filed Vitals:   01/28/14 1038  BP: 140/80  Pulse: 53  Temp: 98.2 F (36.8 C)  Resp: 16   Filed Vitals:   01/28/14 1038  Height: 6' 0.5" (1.842 m)  Weight: 150 lb (68.04 kg)   Body mass index is 20.05 kg/(m^2).  General: Alert, no acute distress HEENT:  Normocephalic, atraumatic, oropharynx patent. EOMI, PERRLA TM nl, erythematous throat, No exudates. +  boggy nares, + sinus tenderness. Cardiovascular:  Sinus brady, no rubs murmurs or gallops.  No Carotid bruits, radial pulse intact. No pedal edema.  Respiratory: Clear to auscultation bilaterally.  No wheezes, rales, or rhonchi.  No cyanosis, no use of accessory musculature GI: No organomegaly, abdomen is soft and non-tender, positive bowel sounds.  No masses. Skin: No rashes. Neurologic: Facial musculature symmetric. Psychiatric: Patient is appropriate throughout our interaction. Lymphatic: No cervical lymphadenopathy Musculoskeletal: Gait intact.   LABS: Results for orders placed or performed in visit on 44/01/02  Basic metabolic panel  Result Value Ref Range   Sodium 141 135 - 145 mEq/L   Potassium 5.0 3.5 - 5.1 mEq/L   Chloride 105 96 - 112 mEq/L   CO2 28 19 - 32 mEq/L   Glucose, Bld 83 70 - 99 mg/dL   BUN 14 6 - 23 mg/dL   Creatinine, Ser 1.0 0.4 - 1.5 mg/dL   Calcium 9.9 8.4 - 10.5 mg/dL   GFR 77.64 >60.00 mL/min  CBC with Differential  Result Value Ref Range   WBC 5.6 4.0 - 10.5 K/uL   RBC 4.65 4.22 - 5.81 Mil/uL   Hemoglobin 14.6 13.0 - 17.0 g/dL   HCT 43.0 39.0 - 52.0 %   MCV 92.5 78.0 - 100.0 fl   MCHC 33.9 30.0 - 36.0 g/dL   RDW 13.2 11.5 - 15.5 %   Platelets 220.0 150.0 - 400.0 K/uL   Neutrophils Relative % 50.9 43.0 - 77.0 %   Lymphocytes Relative 31.9 12.0 - 46.0 %   Monocytes Relative 13.8 (H) 3.0 - 12.0 %   Eosinophils Relative 2.9 0.0 - 5.0 %   Basophils Relative 0.5 0.0 - 3.0 %   Neutro Abs 2.9 1.4 - 7.7 K/uL   Lymphs Abs 1.8 0.7 - 4.0 K/uL   Monocytes Absolute 0.8 0.1 - 1.0 K/uL   Eosinophils Absolute 0.2 0.0 - 0.7 K/uL   Basophils Absolute 0.0 0.0 - 0.1 K/uL  Vitamin B12  Result Value Ref Range   Vitamin B-12 838 211 - 911 pg/mL     EKG/XRAY:   Primary read interpreted by Dr. Marin Comment at [facility].   ASSESSMENT/PLAN: Encounter Diagnosis  Name Primary?  . Acute maxillary sinusitis, recurrence not specified Yes   Rx Flonase Rx Augmentin F/u  prn  Gross sideeffects, risk and benefits, and alternatives of medications d/w patient. Patient is aware that all medications have potential sideeffects and  we are unable to predict every sideeffect or drug-drug interaction that may occur.  Arianni Gallego, [place], DO 01/28/2014 11:06 AM

## 2014-02-03 ENCOUNTER — Telehealth: Payer: Self-pay

## 2014-02-03 NOTE — Telephone Encounter (Signed)
Patient states that the amoxicillin-clavulanate (AUGMENTIN) 875-125 MG per tablet [[id number] he was prescribed on 01/28/14 is not working and he would like another antibiotic. Please advise at  [phone].

## 2014-02-04 MED ORDER — METHYLPREDNISOLONE (PAK) 4 MG PO TABS: ORAL_TABLET | ORAL | Status: DC

## 2014-02-04 NOTE — Telephone Encounter (Signed)
Pt has chronic sinus infections and states that Augmentin has never worked. He wants to have prednisone called in a taper to assist with this sinus pressure. Pt is leaving is Monday at 5 am for [place].   [phone] cell

## 2014-02-04 NOTE — Telephone Encounter (Signed)
Spoke with patient, he felt some relief with augmentin, not completely resoplved.

## 2014-02-26 ENCOUNTER — Telehealth: Payer: Self-pay | Admitting: Internal Medicine

## 2014-02-26 MED ORDER — CYANOCOBALAMIN 1000 MCG/ML IJ SOLN: INTRAMUSCULAR | Status: DC

## 2014-02-26 NOTE — Telephone Encounter (Signed)
Pt request refill cyanocobalamin (,VITAMIN B-12,) 1000 MCG/ML injection Pt is establishing w/ [facility] and his appt is not until 12/21.  Pt woud like to know if we could refill this one more time. [facility] out[facility] pharm

## 2014-02-26 NOTE — Telephone Encounter (Signed)
rx sent in electronically 

## 2014-04-09 ENCOUNTER — Ambulatory Visit: Payer: 59 | Admitting: Sports Medicine

## 2014-06-17 ENCOUNTER — Telehealth: Payer: Self-pay

## 2014-06-17 NOTE — Telephone Encounter (Signed)
Lm on vm  ( need to address pt's Lomotil rx with him)

## 2014-07-10 NOTE — Telephone Encounter (Signed)
Lm on vm 

## 2014-07-24 ENCOUNTER — Ambulatory Visit (INDEPENDENT_AMBULATORY_CARE_PROVIDER_SITE_OTHER): Payer: 59 | Admitting: Sports Medicine

## 2014-07-24 ENCOUNTER — Encounter: Payer: Self-pay | Admitting: Sports Medicine

## 2014-07-24 VITALS — BP 154/75 | Ht 73.0 in | Wt 155.0 lb

## 2014-07-24 DIAGNOSIS — S73199A Other sprain of unspecified hip, initial encounter: Secondary | ICD-10-CM

## 2014-07-24 DIAGNOSIS — M67919 Unspecified disorder of synovium and tendon, unspecified shoulder: Secondary | ICD-10-CM | POA: Insufficient documentation

## 2014-07-24 DIAGNOSIS — M7581 Other shoulder lesions, right shoulder: Secondary | ICD-10-CM

## 2014-07-24 DIAGNOSIS — S76019A Strain of muscle, fascia and tendon of unspecified hip, initial encounter: Secondary | ICD-10-CM | POA: Insufficient documentation

## 2014-07-24 NOTE — Progress Notes (Signed)
Patient ID: Frank Larson, male   DOB: 30-Aug-1950, 64 y.o.   MRN: [id number]  Pain RT shoulder doing flys January Waited 3 days and hurt with swimming Cross over hurts Pain sleeping on RT side at first  He has a past history of some neck issues which led to the winging of the left shoulder  seem to resolve with periscapular exercises  History of some shoulder tendinopathy in the past but not severe  Biking Increased resistance gives bilat GT hip pain  Running would also cause lateral hip pain  He has stopped running but keeps up some biking and elliptical Swimming and weights are the main fitness exercises  Examination No acute distress, thin and muscular BP 154/75 mmHg  Ht 6' 1"  (1.854 m)  Wt 155 lb (70.308 kg)  BMI 20.45 kg/m2  RT Shoulder: Inspection reveals no abnormalities, atrophy or asymmetry. Palpation is normal with no tenderness over AC joint or bicipital groove. ROM is full in all planes. Rotator cuff strength normal throughout. + signs of impingement with pain and weakness on Hawkin's tests, empty can. Speeds and Yergason's tests normal. No labral pathology noted with negative Obrien's, negative clunk and good stability. Normal scapular function observed. He does have some scapular protraction bilaterally No painful arc and no drop arm sign. No apprehension sign  Hips show bilateral full range of motion Hip flexion strength is excellent Gluteus maximus and tensor fascia lata strength is excellent Mild tenderness at the insertion of the gluteus medius just behind the greater trochanter bilaterally Significant weakness in the gluteus medius bilaterally  Ultrasound examination of right shoulder The right supraspinatous shows a large calcification and signs of calcific tendinopathy but no tear Infraspinatus and teres minor are normal Subscapularis normal Bicipital tendon normal A.c. Joint unremarkable for age Slight irregularity of the contour of the humeral  head

## 2014-07-24 NOTE — Assessment & Plan Note (Signed)
Given hip abduction and rotation exercises  I think this is just from not doing specific strengthening   he should gain a lot of strength in 6 weeks

## 2014-07-24 NOTE — Patient Instructions (Signed)
2 hip exercises Lateral lifts and hip rotations Do them often  Shoulder is more complicated You have impingement meaning a spur in the tendon gets caught under the acromium  The idea is to reposition this by working the upper back  And add some back stroke

## 2014-07-24 NOTE — Assessment & Plan Note (Signed)
Since I believe most of this is related to shoulder position and imbalance in strength I suggested we give him a series of scapular stabilization exercises These helped him when he had winging of his left scapula  He is okay to swim but does need to add some backstroke to balance his muscle development  If the calcific tendinopathy continues to be painful we may need to try injections/ barbotage  Recheck 2 months

## 2014-10-23 ENCOUNTER — Encounter: Payer: Self-pay | Admitting: Sports Medicine

## 2014-10-23 ENCOUNTER — Ambulatory Visit (INDEPENDENT_AMBULATORY_CARE_PROVIDER_SITE_OTHER): Payer: 59 | Admitting: Sports Medicine

## 2014-10-23 VITALS — BP 131/66 | HR 56 | Ht 73.0 in | Wt 150.0 lb

## 2014-10-23 DIAGNOSIS — M67919 Unspecified disorder of synovium and tendon, unspecified shoulder: Secondary | ICD-10-CM

## 2014-10-23 NOTE — Progress Notes (Signed)
Patient ID: Frank Larson, male   DOB: 1951-03-27, 64 y.o.   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 With home exercises for the rotator cuff and for stabilization of his scapula he improved dramatically He resumed swimming without difficulty  During his home exercises with 10 pounds on the "robbery Exercise' he had a lot of pain over his left shoulder Since then he has been afraid to push weight lifting or swimming No night pain No significant weakness Painful to reach behind him or to reach in certain positions overhead  Physical examination No acute distress BP 131/66 mmHg  Pulse 56  Ht 6' 1"  (1.854 m)  Wt 150 lb (68.04 kg)  BMI 19.79 kg/m2  Shoulder: Inspection reveals no abnormalities, atrophy or asymmetry. Palpation is normal with no tenderness over AC joint or bicipital groove. ROM is full in all planes. Rotator cuff strength normal throughout. No signs of impingement with negative Neer and Hawkin's tests, empty can. Speeds and Yergason's tests normal. No labral pathology noted with negative Obrien's, negative clunk and good stability.  No painful arc and no drop arm sign. No apprehension sign  There is still some persistent scapular winging and mild abnormality of motion L>R  Ultrasound There are large calcific deposits in the distal left supraspinatous tendon Days look very similar to the ones noted on the right supraspinatous There is some soft tissue calcification at the superior border where the biceps tendon attaches Other rotator cuff muscles are normal A.c. Joint shows mild arthritis

## 2014-10-23 NOTE — Assessment & Plan Note (Signed)
His calcific tendinopathy is primarily painful in certain positions and with certain exercises  I suggested modifying those positions Continue rotator cuff and scapular stabilization exercises However avoid positions with the arm overhead or externally rotated  Return to any activities that do not hurt  If he gets an acute flare we should see him and consider nitroglycerin or barbotage

## 2014-11-20 ENCOUNTER — Ambulatory Visit: Payer: 59 | Admitting: Sports Medicine

## 2014-11-27 ENCOUNTER — Ambulatory Visit: Payer: 59 | Admitting: Sports Medicine

## 2015-04-18 MED FILL — DIPHENOXYLATE-ATROPINE TAB: 2.5-0.025 | 30 days supply | Qty: 120 | Fill #1

## 2015-04-28 DIAGNOSIS — Z23 Encounter for immunization: Secondary | ICD-10-CM | POA: Diagnosis not present

## 2015-04-28 DIAGNOSIS — L570 Actinic keratosis: Secondary | ICD-10-CM | POA: Diagnosis not present

## 2015-04-28 DIAGNOSIS — K5289 Other specified noninfective gastroenteritis and colitis: Secondary | ICD-10-CM | POA: Diagnosis not present

## 2015-05-01 DIAGNOSIS — T887XXA Unspecified adverse effect of drug or medicament, initial encounter: Secondary | ICD-10-CM | POA: Diagnosis not present

## 2015-05-01 DIAGNOSIS — F419 Anxiety disorder, unspecified: Secondary | ICD-10-CM | POA: Diagnosis not present

## 2015-05-01 MED FILL — DOXEPIN 10 MG CAPSULE: 10 | 7 days supply | Qty: 14 | Fill #0

## 2015-05-01 MED FILL — ALPRAZolam 0.25 MG TABS: 0.25 | 10 days supply | Qty: 10 | Fill #0

## 2015-05-23 MED FILL — DIPHENOXYLATE-ATROPINE TAB: 2.5-0.025 | 30 days supply | Qty: 120 | Fill #2

## 2015-05-26 MED FILL — VITAMIN D3 2,000 UNIT TAB: 50 MCG | 120 days supply | Qty: 200 | Fill #1

## 2015-05-27 DIAGNOSIS — E538 Deficiency of other specified B group vitamins: Secondary | ICD-10-CM | POA: Diagnosis not present

## 2015-05-27 DIAGNOSIS — Z79899 Other long term (current) drug therapy: Secondary | ICD-10-CM | POA: Diagnosis not present

## 2015-05-27 DIAGNOSIS — L570 Actinic keratosis: Secondary | ICD-10-CM | POA: Diagnosis not present

## 2015-05-27 DIAGNOSIS — R5383 Other fatigue: Secondary | ICD-10-CM | POA: Diagnosis not present

## 2015-05-27 DIAGNOSIS — F419 Anxiety disorder, unspecified: Secondary | ICD-10-CM | POA: Diagnosis not present

## 2015-05-27 DIAGNOSIS — K5289 Other specified noninfective gastroenteritis and colitis: Secondary | ICD-10-CM | POA: Diagnosis not present

## 2015-05-27 DIAGNOSIS — Z0000 Encounter for general adult medical examination without abnormal findings: Secondary | ICD-10-CM | POA: Diagnosis not present

## 2015-05-27 DIAGNOSIS — Z125 Encounter for screening for malignant neoplasm of prostate: Secondary | ICD-10-CM | POA: Diagnosis not present

## 2015-06-02 MED FILL — CYANOCOBALAMIN 1,000 MCG/ML: 1000 | 90 days supply | Qty: 3 | Fill #3

## 2015-06-12 ENCOUNTER — Ambulatory Visit (INDEPENDENT_AMBULATORY_CARE_PROVIDER_SITE_OTHER): Payer: 59 | Admitting: Sports Medicine

## 2015-06-12 ENCOUNTER — Encounter: Payer: Self-pay | Admitting: Sports Medicine

## 2015-06-12 VITALS — BP 144/80 | HR 59 | Ht 73.0 in | Wt 152.0 lb

## 2015-06-12 DIAGNOSIS — M67912 Unspecified disorder of synovium and tendon, left shoulder: Secondary | ICD-10-CM | POA: Diagnosis not present

## 2015-06-12 MED ORDER — NITROGLYCERIN 0.2 MG/HR TD PT24: MEDICATED_PATCH | TRANSDERMAL | Status: DC

## 2015-06-12 MED FILL — NITROGLYCERIN 0.2 MG/HR PTC: 0.2 | 28 days supply | Qty: 7 | Fill #0

## 2015-06-12 NOTE — Progress Notes (Signed)
  Frank Larson - 65 y.o. male MRN [id number]  Date of birth: [birth year]  SUBJECTIVE:  Including CC & ROS.  Frank Larson is a 65 y.o. male who presents today for L shoulder pain.    Shoulder Pain L, f/u RTC tedonopathy - patient presents today with ongoing left shoulder pain. This is been ongoing now for 3 days after he was doing some weightlifting but light weights higher reps. Nothing above the shoulder. He also has been doing swimming which has bothered him with pain. Pains worse with overhead motion or severe abduction. He has not tried ibuprofen but rest has helped somewhat. No paresthesias going down his arm. No nighttime awakenings or trauma. Previous ultrasound of left shoulder at showing calcific tendinopathy but no tears.   PMHx - Updated and reviewed.  Contributory factors include: cervical DJD, RTC tendonopathy  PSHx - Updated and reviewed.  Contributory factors include:  Negative  FHx - Updated and reviewed.  Contributory factors include:  HLD/CAD  Social Hx - Updated and reviewed. Contributory factors include: Non smoker   Medications - Updated/reviewed    ROS Per HPI   PE: Filed Vitals:   06/12/15 0903  BP: 144/80  Pulse: 59   Gen: NAD, AAO 3 Cardio- RRR Pulm - Normal respiratory effort/rate Skin: No rashes or erythema Extremities: No edema  Vascular: pulses +2 bilateral upper and lower extremity Psych: Normal affect   MSK Shoulder:  L Shoulder: Inspection reveals no abnormalities, atrophy or asymmetry. Palpation is normal with no tenderness over AC joint or bicipital groove. ROM - 180 degrees flexion, 60 degrees extension, 100 abduction, 90 ER/IR  Rotator cuff strength normal throughout.  Empty can 5/5 + impingement with + Neer and Hawkin's tests Speeds and Yergason's tests normal. No labral pathology noted with negative Obrien's, negative crank, negative anterior glide, negative compression/rotation and good stability. Normal scapular function observed. +  painful arc and no drop arm sign. No apprehension sign and no Jobe relocation sign  Negative Cross arm maneuver against resistance   Neurovascular status - Intact B/L UE  US imaging:  Obvious calcification at the anterior portion of the supraspinatus measuring around 3-4 mm. No obvious tears but there is some shadowing deep to the calcification. No evidence of ring down artifact or comet tail artifact. There is obvious dynamic impingement of the supraspinatus. Biceps without anechoic fluid and his subscap/teres minor appear normal as well as infraspinatus. AC Joint with some osteophytes but no mushroom sign

## 2015-06-12 NOTE — Patient Instructions (Signed)

## 2015-06-12 NOTE — Assessment & Plan Note (Signed)
Obvious calcification at the anterior portion of the supraspinatus measuring around 3-4 mm. No obvious tears but there is some shadowing deep to the calcification. No evidence of ring down artifact or comet tail artifact. There is obvious dynamic impingement of the supraspinatus. Biceps without anechoic fluid and his subscap/teres minor appear normal as well as infraspinatus. AC Joint with some osteophytes but no mushroom sign  - Conservative management with activity modification, RTC strengthening, ice, nsaids, nitro - F/U in 4-6 weeks, consider calcific barbotage under US guidance if still a problem.

## 2015-07-01 MED FILL — DIPHENOXYLATE-ATROPINE TAB: 2.5-0.025 | 30 days supply | Qty: 120 | Fill #3

## 2015-07-03 ENCOUNTER — Ambulatory Visit: Payer: 59 | Admitting: Sports Medicine

## 2015-07-22 ENCOUNTER — Ambulatory Visit: Payer: 59 | Admitting: Sports Medicine

## 2015-07-28 MED FILL — DIPHENOXYLATE-ATROPINE TAB: 2.5-0.025 | 30 days supply | Qty: 120 | Fill #4

## 2015-08-12 ENCOUNTER — Encounter: Payer: Self-pay | Admitting: Internal Medicine

## 2015-08-18 MED FILL — CYANOCOBALAMIN 1,000 MCG/ML: 1000 | 56 days supply | Qty: 2 | Fill #4

## 2015-08-22 MED FILL — DIPHENOXYLATE-ATROPINE TAB: 2.5-0.025 | 30 days supply | Qty: 120 | Fill #0

## 2015-09-23 ENCOUNTER — Encounter: Payer: Self-pay | Admitting: Sports Medicine

## 2015-09-23 ENCOUNTER — Ambulatory Visit (INDEPENDENT_AMBULATORY_CARE_PROVIDER_SITE_OTHER): Payer: 59 | Admitting: Sports Medicine

## 2015-09-23 VITALS — BP 130/74 | Ht 73.0 in | Wt 150.0 lb

## 2015-09-23 DIAGNOSIS — M67912 Unspecified disorder of synovium and tendon, left shoulder: Secondary | ICD-10-CM

## 2015-09-23 MED FILL — FLUTICASONE PROP 50 MCG SPR: 50 | 30 days supply | Qty: 16 | Fill #0

## 2015-09-23 NOTE — Progress Notes (Signed)
Patient ID: Frank Larson, male   DOB: 11-28-1950, 65 y.o.   MRN: [id number]  CC: L shoulder pain  F/u of calcific SST of left rotator cuff Feels weaker with swimming  Used NTG and got good relief of pain but bad headaches Finally stopped  Now sxs coming back Continues to hurt in strokes in position of impingement  Certain lifts will hurt at point of shoulder as well No night pain  R Elbow Pain at medial epicondyle Hurts with reverse curl  ROS No radiating pain f cervical spine at present Poor sleep pattern and rolling onto shoulder wakens him  Exam NAD/ thin but strong M BP 130/74 mmHg  Ht 6' 1"  (1.854 m)  Wt 150 lb (68.04 kg)  BMI 19.79 kg/m2  Shoulder: Inspection reveals no abnormalities, atrophy or asymmetry. Palpation is normal with no tenderness over AC joint or bicipital groove. ROM is full in all planes. Rotator cuff strength normal throughout. + signs of impingement with  Neer and Hawkin's tests,  empty can is mildly + - gives f pain but not weakness  Speeds and Yergason's tests normal. No labral pathology noted with negative Obrien's, negative clunk and good stability. Normal scapular function observed. - this is improved No painful arc and no drop arm sign. No apprehension sign

## 2015-09-23 NOTE — Assessment & Plan Note (Signed)
His strength is improved However, impingement sxs are still limiting his swimming  We will schedule him for elective Barbotage of left shoulder under [place]

## 2015-10-02 MED FILL — DIPHENOXYLATE/ATROPINE TAB: 2.5-0.025 | 30 days supply | Qty: 120 | Fill #1

## 2015-10-07 ENCOUNTER — Encounter: Payer: Self-pay | Admitting: Sports Medicine

## 2015-10-07 ENCOUNTER — Ambulatory Visit (INDEPENDENT_AMBULATORY_CARE_PROVIDER_SITE_OTHER): Payer: 59 | Admitting: Sports Medicine

## 2015-10-07 VITALS — BP 146/75 | HR 48 | Ht 71.0 in | Wt 150.0 lb

## 2015-10-07 DIAGNOSIS — M25512 Pain in left shoulder: Secondary | ICD-10-CM | POA: Diagnosis not present

## 2015-10-07 DIAGNOSIS — M67912 Unspecified disorder of synovium and tendon, left shoulder: Secondary | ICD-10-CM | POA: Diagnosis not present

## 2015-10-07 MED ORDER — HYDROCODONE-ACETAMINOPHEN 5-325 MG PO TABS: 1.0000 | ORAL_TABLET | ORAL | Status: DC | PRN

## 2015-10-07 MED ORDER — METHYLPREDNISOLONE ACETATE 40 MG/ML IJ SUSP
40.0000 mg | Freq: Once | INTRAMUSCULAR | Status: AC
  Administered 2015-10-07: 40 mg via INTRA_ARTICULAR

## 2015-10-07 MED FILL — HYDROCODON-APAP 5-325: 5-325 | 2 days supply | Qty: 12 | Fill #0

## 2015-10-07 NOTE — Assessment & Plan Note (Signed)
We will monitor for response to procedure  5 days of rest with only motion exercise  Calcifications appear dense so unsure if able to dissolve  Plan repeat scan in 6 weeks  Norco if needed for pain next 3 days

## 2015-10-07 NOTE — Progress Notes (Signed)
Patient ID: Frank Larson, male   DOB: Feb 03, 1951, 64 y.o.   MRN: [id number]  Patient with chronic Supraspinatus calcific tendinopathy Primarily bothers him in sweimming  And certain motions   Brought to office today for elective barbotage of SST under US guidance.  Exam BP 146/75 mmHg  Pulse 48  Ht 5' 11"  (1.803 m)  Wt 150 lb (68.04 kg)  BMI 20.93 kg/m2 Gen: NAD  + impingement findings [place] pre procedure shows calcific densities in the distal supraspinatus No tear seen  Procedure: Barbotage of SST Left Prep with chlorhexidine and alcohol Wheal anterior portal and lateral portal with 1% lidocaine Insertion of 18G needle in anterior portal to the level of the calcifications Infusion of 45 deg. Of saline without difficulty Few cc's of aspirate in lateral portal 5 ccs aspirate in ant. Portal  Note I did not see dissolution of calcium with the barbotage  Post aspiration Injection of 1 cc solumedrol 40 and 2ccs lidocaine 1% into subacromial space  Procedure completed without difficulty

## 2015-10-23 MED FILL — CYANOCOBALAMIN 1,000 MCG/ML: 1000 | 84 days supply | Qty: 3 | Fill #0

## 2015-10-31 DIAGNOSIS — Z23 Encounter for immunization: Secondary | ICD-10-CM | POA: Diagnosis not present

## 2015-10-31 DIAGNOSIS — J0121 Acute recurrent ethmoidal sinusitis: Secondary | ICD-10-CM | POA: Diagnosis not present

## 2015-10-31 MED FILL — DIPHENOXYLATE/ATROPINE TAB: 2.5-0.025 | 30 days supply | Qty: 120 | Fill #2

## 2015-10-31 MED FILL — AMOX-CLAV 875-125 MG TABLET: 875-125 | 10 days supply | Qty: 20 | Fill #0

## 2015-11-04 DIAGNOSIS — K52839 Microscopic colitis, unspecified: Secondary | ICD-10-CM | POA: Diagnosis not present

## 2015-11-04 DIAGNOSIS — Z79899 Other long term (current) drug therapy: Secondary | ICD-10-CM | POA: Diagnosis not present

## 2015-11-04 DIAGNOSIS — Z792 Long term (current) use of antibiotics: Secondary | ICD-10-CM | POA: Diagnosis not present

## 2015-11-04 DIAGNOSIS — K52832 Lymphocytic colitis: Secondary | ICD-10-CM | POA: Diagnosis not present

## 2015-11-05 DIAGNOSIS — J0101 Acute recurrent maxillary sinusitis: Secondary | ICD-10-CM | POA: Diagnosis not present

## 2015-11-06 ENCOUNTER — Other Ambulatory Visit: Payer: Self-pay | Admitting: Geriatric Medicine

## 2015-11-06 ENCOUNTER — Ambulatory Visit
Admission: RE | Admit: 2015-11-06 | Discharge: 2015-11-06 | Disposition: A | Discharge status: Home / Self Care | Payer: 59 | Source: Ambulatory Visit | Attending: Geriatric Medicine | Admitting: Geriatric Medicine

## 2015-11-06 DIAGNOSIS — J0101 Acute recurrent maxillary sinusitis: Secondary | ICD-10-CM

## 2015-11-06 DIAGNOSIS — J329 Chronic sinusitis, unspecified: Secondary | ICD-10-CM | POA: Diagnosis not present

## 2015-11-21 MED FILL — FLUTICASONE PROP 50 MCG SPR: 50 | 30 days supply | Qty: 16 | Fill #1

## 2015-11-25 ENCOUNTER — Encounter: Payer: Self-pay | Admitting: Sports Medicine

## 2015-11-25 ENCOUNTER — Ambulatory Visit (INDEPENDENT_AMBULATORY_CARE_PROVIDER_SITE_OTHER): Payer: 59 | Admitting: Sports Medicine

## 2015-11-25 DIAGNOSIS — M67912 Unspecified disorder of synovium and tendon, left shoulder: Secondary | ICD-10-CM

## 2015-11-25 NOTE — Assessment & Plan Note (Signed)
A: He has symptomatic relief since treatment last month with some reduced level of exertion. His exam today is much improved without any evidence of impingement. No catching or crepitus on examination. It appears his calcifications are also decreased at this time.  P: Recommend slow return to normal activity level Limit overheat resistance exercises Can follow up as needed

## 2015-11-25 NOTE — Progress Notes (Signed)
   HPI  CC: Follow up for left shoulder pain  Frank Larson is a 65 y/o man seen today for 6 week follow up of his left shoulder pain. He was seen in July for barbotage for rotator cuff tendinopathy with calcifications. He has not maintained as much exercise as usual due to getting over a sinus infection and symptoms for the past 4-6 weeks. However he has felt substantial improvement and his shoulder is not hurting at his current level of activity.  Medications/Interventions Tried: Barbotage 6 wks ago   See HPI for ROS.;  Note no radicular sxs/ no neck pain/ pain relief started within 24 hrs of barbotage/ Crepitation in lt shoulder resolved CC and VS noted.  Objective: BP 127/73   Ht 6' 1"  (1.854 m)   Wt 155 lb (70.3 kg)   BMI 20.45 kg/m  Gen: NAD, alert, cooperative. CV: Well-perfused. Resp: Non-labored. Neuro: Sensation intact throughout.  Left Shoulder: Inspection reveals no abnormalities, atrophy or asymmetry. Palpation is normal with no tenderness over AC joint or bicipital groove. ROM is full in all planes. Rotator cuff strength normal throughout. No signs of impingement with negative Neer and Hawkin's tests, empty can. No painful arc and no drop arm sign. Mild pain on external rotation against resistance No crepitus  Ultrasound Calcification is much less.  Limited to SSP tendon distally; No RC tear  Assessment and plan:  Tendinopathy of rotator cuff A: He has symptomatic relief since treatment last month with some reduced level of exertion. His exam today is much improved without any evidence of impingement. No catching or crepitus on examination. It appears his calcifications are also decreased at this time.  P: Recommend slow return to normal activity level Limit overheat resistance exercises Can follow up as needed   No orders of the defined types were placed in this encounter.   No orders of the defined types were placed in this encounter.   Collier Salina, MD PGY-II Internal Medicine Resident

## 2015-11-26 DIAGNOSIS — R5382 Chronic fatigue, unspecified: Secondary | ICD-10-CM | POA: Diagnosis not present

## 2015-11-26 DIAGNOSIS — Z8669 Personal history of other diseases of the nervous system and sense organs: Secondary | ICD-10-CM | POA: Diagnosis not present

## 2015-11-28 MED FILL — BUDESONIDE EC 3 MG CAPSULE: 3 | 90 days supply | Qty: 270 | Fill #0

## 2015-12-05 MED FILL — DIPHENOXYLATE-ATROP 2.5-0.0: 2.5-0.025 | 30 days supply | Qty: 120 | Fill #0

## 2016-01-05 MED FILL — DIPHENOXYLATE-ATROP 2.5-0.0: 2.5-0.025 | 30 days supply | Qty: 120 | Fill #1

## 2016-01-08 ENCOUNTER — Ambulatory Visit (INDEPENDENT_AMBULATORY_CARE_PROVIDER_SITE_OTHER): Payer: 59 | Admitting: Family Medicine

## 2016-01-08 VITALS — BP 142/70 | HR 56 | Temp 97.9°F | Resp 18 | Ht 73.0 in | Wt 146.0 lb

## 2016-01-08 DIAGNOSIS — R5383 Other fatigue: Secondary | ICD-10-CM | POA: Diagnosis not present

## 2016-01-08 DIAGNOSIS — G4700 Insomnia, unspecified: Secondary | ICD-10-CM

## 2016-01-08 LAB — CBC
HCT: 41.9 % (ref 38.5–50.0)
HEMOGLOBIN: 14.6 g/dL (ref 13.2–17.1)
MCH: 31.1 pg (ref 27.0–33.0)
MCHC: 34.8 g/dL (ref 32.0–36.0)
MCV: 89.1 fL (ref 80.0–100.0)
MPV: 9 fL (ref 7.5–12.5)
Platelets: 258 10*3/uL (ref 140–400)
RBC: 4.7 MIL/uL (ref 4.20–5.80)
RDW: 13.1 % (ref 11.0–15.0)
WBC: 10.7 10*3/uL (ref 3.8–10.8)

## 2016-01-08 LAB — TSH: TSH: 1.37 mIU/L (ref 0.40–4.50)

## 2016-01-08 NOTE — Progress Notes (Addendum)
Frank Larson is a 65 y.o. male who presents to [facility] today for fatigue:  1.  Fatigue:  Patient is been having trouble with fatigue for the past 9-10 months. He is concerned he may have Lyme disease. He has a vacation [place] and states he has multiple take bites to 3 times a year. Is worse in the following winter. His last echo was in January. He did note a circular red rash around this most recent it might. Last for several months. Does have some occasional neck pain and stiffness. Chronic low back pain. Denies any other joint pains.  The last 2 to 3 months is having worsening trouble with sleep. He states he has never had normal sleep. States he wakes most nights. Past several nights he has really not slept at all. This is sometimes happen 2-3 nights in a row. States he feels miserable the next day.  ROS as above.  Does endorse some night sweats for the past 2-3 months as well. Has lost about 8 pounds of weight and that time as well. Unintentional. He  Has history of lymphocytic colitis. Was seen at [facility] for this this summer. Had normal labs at that time.  PMH reviewed. Patient is a nonsmoker.   Past Medical History:  Diagnosis Date  . Allergy   . Arthritis   . Colitis   . Diverticulosis   . GERD (gastroesophageal reflux disease)   . Hyperlipidemia    Past Surgical History:  Procedure Laterality Date  . COLONOSCOPY  08/07/2002  . INGUINAL HERNIA REPAIR    . NASAL SINUS SURGERY    . NOSE SURGERY     broken nose    Medications reviewed. Current Outpatient Prescriptions  Medication Sig Dispense Refill  . budesonide (ENTOCORT EC) 3 MG 24 hr capsule TAKE 3 CAPSULES (9 MG TOTAL) BY MOUTH DAILY. 270 capsule 2  . cyanocobalamin (,VITAMIN B-12,) 1000 MCG/ML injection INJECT 1ML ONCE MONTHLY 10 mL 0  . diphenoxylate-atropine (LOMOTIL) 2.5-0.025 MG per tablet TAKE 1 TABLET BY MOUTH FOUR TIMES DAILY AS NEEDED FOR DIARRHEA OR LOOSE STOOLS. 360  tablet 1  . fluticasone (FLONASE) 50 MCG/ACT nasal spray Place 2 sprays into both nostrils daily. 48 g 3  . zolpidem (AMBIEN) 10 MG tablet   5   No current facility-administered medications for this visit.      Physical Exam:  BP (!) 142/70 (BP Location: Right Arm, Patient Position: Sitting, Cuff Size: Normal)   Pulse (!) 56   Temp 97.9 F (36.6 C) (Oral)   Resp 18   Ht 6' 1"  (1.854 m)   Wt 146 lb (66.2 kg)   SpO2 97%   BMI 19.26 kg/m  Gen:  Alert, cooperative patient who appears stated age in no acute distress.  Vital signs reviewed.  Thin male. HEENT: EOMI,  MMM Neck: Supple without stiffness currently. No lymphadenopathy noted. Pulm:  Clear to auscultation bilaterally  Cardiac:  Regular rate and rhythm without murmur  Exts: Non edematous BL  LE, warm and well perfused.  Neuro: No focal deficits noted. Balance is good. Skin:  Does have 3-4 mm diameter scar located on the medial aspect of his right thigh just inferior to inguinal crease. No current erythema or rash. No rash elsewhere.  Assessment and Plan:  1.  Fatigue: -We will check for Lyme disease today. -He does have what sounds to be history of the target rash. -However he has not had any  arthralgias or other neurological issues. -Fatigue may also be secondary to poor sleep. -He has been referred for sleep study in the past but has not followed up with this. -His labs to return back negative hour refer him to Dr. Maxwell Caul for evaluation for sleep. -Also checking TSH with gentle sees been checked recently. -CBC as well. Denies any melena. States his next colonoscopy is due in 2021

## 2016-01-08 NOTE — Patient Instructions (Addendum)
It was good to meet you today!  We are checking the Lyme titers as well as thyroid today for fatigue.  I don't see this has been checked.   We will let you know the results when they return.    IF you received an x-ray today, you will receive an invoice from [facility]. Please contact [facility] at [phone] with questions or concerns regarding your invoice.   IF you received labwork today, you will receive an invoice from [employer]. Please contact [facility] at [phone] with questions or concerns regarding your invoice.   Our billing staff will not be able to assist you with questions regarding bills from these companies.  You will be contacted with the lab results as soon as they are available. The fastest way to get your results is to activate your My Chart account. Instructions are located on the last page of this paperwork. If you have not heard from [place] regarding the results in 2 weeks, please contact this office.

## 2016-01-09 LAB — LYME AB/WESTERN BLOT REFLEX

## 2016-01-12 MED FILL — FLUTICASONE PROP 50 MCG SPR: 50 | 30 days supply | Qty: 16 | Fill #2

## 2016-01-14 ENCOUNTER — Telehealth: Payer: Self-pay

## 2016-01-14 NOTE — Telephone Encounter (Signed)
Please review labs Dr. Mingo Amber.

## 2016-01-26 MED FILL — CYANOCOBALAMIN 1,000 MCG/ML: 1000 | 84 days supply | Qty: 3 | Fill #1

## 2016-01-27 DIAGNOSIS — G4700 Insomnia, unspecified: Secondary | ICD-10-CM | POA: Diagnosis not present

## 2016-01-27 MED FILL — ESZOPICLONE 3 MG TABLET: 3 | 30 days supply | Qty: 30 | Fill #0

## 2016-02-05 DIAGNOSIS — G4700 Insomnia, unspecified: Secondary | ICD-10-CM | POA: Diagnosis not present

## 2016-02-09 MED FILL — DIPHENOXYLATE/ATROPINE TAB: 2.5-0.025 | 30 days supply | Qty: 120 | Fill #2

## 2016-02-10 DIAGNOSIS — K52839 Microscopic colitis, unspecified: Secondary | ICD-10-CM | POA: Diagnosis not present

## 2016-02-12 DIAGNOSIS — G4700 Insomnia, unspecified: Secondary | ICD-10-CM | POA: Diagnosis not present

## 2016-02-12 DIAGNOSIS — K5289 Other specified noninfective gastroenteritis and colitis: Secondary | ICD-10-CM | POA: Diagnosis not present

## 2016-02-12 DIAGNOSIS — L570 Actinic keratosis: Secondary | ICD-10-CM | POA: Diagnosis not present

## 2016-02-27 MED FILL — FLUTICASONE PROP 50 MCG SPR: 50 | 30 days supply | Qty: 16 | Fill #3

## 2016-02-27 MED FILL — ESZOPICLONE 3 MG TABLET: 3 | 30 days supply | Qty: 30 | Fill #0

## 2016-03-11 MED FILL — DIPHENOXYLATE/ATROPINE TAB: 2.5-0.025 | 90 days supply | Qty: 540 | Fill #0

## 2016-03-18 ENCOUNTER — Ambulatory Visit (INDEPENDENT_AMBULATORY_CARE_PROVIDER_SITE_OTHER): Payer: 59 | Admitting: Sports Medicine

## 2016-03-18 ENCOUNTER — Ambulatory Visit: Payer: Self-pay

## 2016-03-18 ENCOUNTER — Encounter: Payer: Self-pay | Admitting: Sports Medicine

## 2016-03-18 VITALS — BP 140/70 | Ht 73.0 in | Wt 150.0 lb

## 2016-03-18 DIAGNOSIS — M7701 Medial epicondylitis, right elbow: Secondary | ICD-10-CM | POA: Diagnosis not present

## 2016-03-18 DIAGNOSIS — M67912 Unspecified disorder of synovium and tendon, left shoulder: Secondary | ICD-10-CM

## 2016-03-18 DIAGNOSIS — M7702 Medial epicondylitis, left elbow: Secondary | ICD-10-CM

## 2016-03-18 DIAGNOSIS — M25512 Pain in left shoulder: Secondary | ICD-10-CM

## 2016-03-18 NOTE — Assessment & Plan Note (Signed)
Calcific changes are improved. Gave further exercises for the shoulder to progress strength and range of motion. Advised working on upper back and can also start on the pectoralis muscle strengthening. He can swim as tolerated. Gave activity modifications to follow.

## 2016-03-18 NOTE — Progress Notes (Signed)
Frank Larson - 65 y.o. male MRN [id number]  Date of birth: 1951/01/06  SUBJECTIVE:  Including CC & ROS.  CC: left shoulder pain  Presents in follow-up for left shoulder pain. He has a history of calcific tendinitis of his left rotator cuff. He had a Bier times procedure done previously in July which he believed helped for about 6 weeks. He states that he started having pain again about a month ago. He is still swimming but swims slower. He was lifting things for a holiday party last weekend and feels he exacerbated it after doing that and swimming. He is looking for more exercises to do to help with this. He also complains of bilateral medial elbow pain right greater than left. He is doing exercises for this is not completely helping. It is worse when he is lifting. He was doing exercises such as squeezing a tennis ball who was only able to do that for 2 days before he started having pain.   ROS: No unexpected weight loss, fever, chills, swelling, instability, muscle pain, numbness/tingling, redness, otherwise see HPI   PMHx - Updated and reviewed.  Contributory factors include: Negative PSHx - Updated and reviewed.  Contributory factors include:  Negative FHx - Updated and reviewed.  Contributory factors include:  Negative Social Hx - Updated and reviewed. Contributory factors include: Negative Medications - reviewed   DATA REVIEWED: Previous office visits  PHYSICAL EXAM:  VS: BP:140/70  HR: bpm  TEMP: ( )  RESP:   HT:6' 1"  (185.4 cm)   WT:150 lb (68 kg)  BMI:19.8 PHYSICAL EXAM: Gen: NAD, alert, cooperative with exam, well-appearing HEENT: clear conjunctiva,  CV:  no edema, capillary refill brisk, normal rate Resp: non-labored Skin: no rashes, normal turgor  Neuro: no gross deficits.  Psych:  alert and oriented  Shoulder: Inspection reveals no abnormalities, atrophy or asymmetry. Palpation is normal with no tenderness over AC joint or bicipital groove. ROM is full in flexion  and abduction, but decreased in extension and internal rotation Rotator cuff strength normal throughout. + signs of impingement with positive Neer and Hawkin's tests, empty can sign. No labral pathology noted with negative Obrien's, negative clunk and good stability. Normal scapular function observed. No painful arc and no drop arm sign. No apprehension sign  Elbow: Unremarkable to inspection. Range of motion full pronation, supination, flexion, extension. Strength is full to all of the above directions Stable to varus, valgus stress. Negative moving valgus stress test. Tenderness to palpation over medial epicondyle bilaterally right greater than left. Grip strength equal on both sides Negative cubital tunnel Tinel's.  Limited ultrasound of the shoulder performed, left. Biceps tendon viewed and long and short axis with normal fibrillar pattern of swelling surrounding. Pectoralis tendon insertion viewed without any swelling or tearing. Subscapularis showed some calcific changes to the area without any discrete areas of tearing. Supraspinatus is with some calcifications especially on the anterior aspect but decreased from previous ultrasound infraspinatus and teres minor is reviewed without any changes in fibular pattern, hypoechoic changes, calcific changes.AC joint was viewed without any effusion and slight bone spurring is present. Findings consistent with calcific tendinitis of the supraspinatus and subscapularis, improving. Limited ultrasound of the bilateral elbow viewed. Left elbow was viewed at the medial epicondyles without any changes in fibrillar pattern, but mild amount of swelling. No calcific changes. No increased blood flow to this area.  right elbow viewed at the medial epicondyles with mild swelling seen surrounding the tendon. No calcific changes seen. Increased  Doppler flow to the area of insertion. Findings consistent with right medial epicondylitis.     ASSESSMENT & PLAN:    Tendinopathy of rotator cuff Calcific changes are improved. Gave further exercises for the shoulder to progress strength and range of motion. Advised working on upper back and can also start on the pectoralis muscle strengthening. He can swim as tolerated. Gave activity modifications to follow.  Medial epicondylitis of both elbows Gave activity modifications and further exercises to do to strengthen at the elbows. Follow-up in 6 weeks.  Patient was counseled reviewing diagnosis and treatment in detail, totaling in 30 minutes, over half of which was spent in face to face counseling.

## 2016-03-18 NOTE — Assessment & Plan Note (Signed)
Gave activity modifications and further exercises to do to strengthen at the elbows. Follow-up in 6 weeks.

## 2016-03-31 ENCOUNTER — Other Ambulatory Visit: Payer: Self-pay | Admitting: Dermatology

## 2016-03-31 DIAGNOSIS — L821 Other seborrheic keratosis: Secondary | ICD-10-CM | POA: Diagnosis not present

## 2016-03-31 DIAGNOSIS — D229 Melanocytic nevi, unspecified: Secondary | ICD-10-CM | POA: Diagnosis not present

## 2016-03-31 DIAGNOSIS — L111 Transient acantholytic dermatosis [Grover]: Secondary | ICD-10-CM | POA: Diagnosis not present

## 2016-03-31 DIAGNOSIS — C44319 Basal cell carcinoma of skin of other parts of face: Secondary | ICD-10-CM | POA: Diagnosis not present

## 2016-03-31 DIAGNOSIS — D492 Neoplasm of unspecified behavior of bone, soft tissue, and skin: Secondary | ICD-10-CM | POA: Diagnosis not present

## 2016-04-02 MED FILL — TRIAMCINOLONE 0.1% CREAM: 0.1 | 5 days supply | Qty: 80 | Fill #0

## 2016-04-22 ENCOUNTER — Other Ambulatory Visit: Payer: Self-pay | Admitting: Dermatology

## 2016-04-22 DIAGNOSIS — C44319 Basal cell carcinoma of skin of other parts of face: Secondary | ICD-10-CM | POA: Diagnosis not present

## 2016-04-22 MED FILL — FLUTICASONE PROP 50 MCG SPR: 50 | 30 days supply | Qty: 16 | Fill #4

## 2016-04-27 MED FILL — CYANOCOBALAMIN 1,000 MCG/ML: 1000 | 90 days supply | Qty: 3 | Fill #0

## 2016-04-28 DIAGNOSIS — C4491 Basal cell carcinoma of skin, unspecified: Secondary | ICD-10-CM

## 2016-04-28 HISTORY — DX: Basal cell carcinoma of skin, unspecified: C44.91

## 2016-05-13 DIAGNOSIS — F4323 Adjustment disorder with mixed anxiety and depressed mood: Secondary | ICD-10-CM | POA: Diagnosis not present

## 2016-05-17 MED FILL — ESZOPICLONE 3 MG TABLET: 3 | 30 days supply | Qty: 30 | Fill #1

## 2016-05-28 DIAGNOSIS — F4323 Adjustment disorder with mixed anxiety and depressed mood: Secondary | ICD-10-CM | POA: Diagnosis not present

## 2016-06-11 DIAGNOSIS — F4323 Adjustment disorder with mixed anxiety and depressed mood: Secondary | ICD-10-CM | POA: Diagnosis not present

## 2016-06-23 MED FILL — ESZOPICLONE 3 MG TABLET: 3 | 30 days supply | Qty: 30 | Fill #2

## 2016-06-24 DIAGNOSIS — F4323 Adjustment disorder with mixed anxiety and depressed mood: Secondary | ICD-10-CM | POA: Diagnosis not present

## 2016-07-09 DIAGNOSIS — F4323 Adjustment disorder with mixed anxiety and depressed mood: Secondary | ICD-10-CM | POA: Diagnosis not present

## 2016-07-12 MED FILL — FLUTICASONE PROP 50 MCG SPR: 50 | 30 days supply | Qty: 16 | Fill #5

## 2016-07-12 MED FILL — DIPHENOXYLATE/ATROPINE TAB: 2.5-0.025 | 90 days supply | Qty: 540 | Fill #1

## 2016-07-16 DIAGNOSIS — F4323 Adjustment disorder with mixed anxiety and depressed mood: Secondary | ICD-10-CM | POA: Diagnosis not present

## 2016-07-16 MED FILL — ZOLPIDEM TARTRATE 10 MG TAB: 10 | 30 days supply | Qty: 30 | Fill #0

## 2016-07-22 MED FILL — ESZOPICLONE 3 MG TABLET: 3 | 30 days supply | Qty: 30 | Fill #3

## 2016-07-22 MED FILL — CYANOCOBALAMIN 1,000 MCG/ML: 1000 | 90 days supply | Qty: 3 | Fill #1

## 2016-07-23 DIAGNOSIS — F4323 Adjustment disorder with mixed anxiety and depressed mood: Secondary | ICD-10-CM | POA: Diagnosis not present

## 2016-08-16 MED FILL — ESZOPICLONE 3 MG TABLET: 3 | 30 days supply | Qty: 30 | Fill #0

## 2016-08-27 MED FILL — FLUTICASONE PROP 50 MCG SPR: 50 | 30 days supply | Qty: 16 | Fill #0

## 2016-08-27 MED FILL — BUDESONIDE 3 MG CAP: 3 | 30 days supply | Qty: 90 | Fill #1

## 2016-09-17 DIAGNOSIS — H019 Unspecified inflammation of eyelid: Secondary | ICD-10-CM | POA: Diagnosis not present

## 2016-09-17 MED FILL — ERYTHROMYCIN EYE OINTMENT: 5 | 10 days supply | Qty: 4 | Fill #0

## 2016-09-17 MED FILL — ESZOPICLONE 3 MG TABLET: 3 | 30 days supply | Qty: 30 | Fill #1

## 2016-09-23 ENCOUNTER — Ambulatory Visit (INDEPENDENT_AMBULATORY_CARE_PROVIDER_SITE_OTHER): Payer: 59 | Admitting: Sports Medicine

## 2016-09-23 ENCOUNTER — Encounter: Payer: Self-pay | Admitting: Sports Medicine

## 2016-09-23 DIAGNOSIS — M47812 Spondylosis without myelopathy or radiculopathy, cervical region: Secondary | ICD-10-CM

## 2016-09-23 NOTE — Patient Instructions (Signed)
Isometric Stretching of the Neck:   Neck Extension with resistance against your hand (behind your head) Neck Flexion with resistance against your hand (on your forehead) Neck Side Bend with resistance against your hand (on either side of the head)  **remember these are isometric exercises so your head is not supposed to have much/or any movement.   Neck Extension initially without resistance (this is the exercise where you want to move your entire neck, not just extend your neck so your chin goes up)

## 2016-09-23 NOTE — Progress Notes (Signed)
   Subjective:    Patient ID: Frank Larson, male    DOB: [birth year], 66 y.o.   MRN: [id number]  HPI Frank Larson is a 66 yo male who presents with neck pain. He reports that he had neck pain "years ago" which resolved with PT. But over the past 9 months to 1 year he has some low neck pain. He points to C6-C7 area and reports sometimes it spreads slightly below and to the paraspinal area of the same region. He reports that he did have radiation of pain to his bilateral arms when he was driving to [place] with his daughter but has not had this more recently. Denies weakness, numbness or tingling of the upper extremities. Denies any specific injury to the area. 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 Flexion of the neck relieves his pain. No fevers, chills, night sweats.   Review of Systems No fevers, chills, night sweats  Radiation of pain to bilateral arms No numbness, weakness or tingling of the upper extremities     Objective:   Physical Exam Gen: NAD Neck:  Normal flexion Mildly diminished extension with discomfort  Normal lateral rotation Mildly diminished side bend of the neck Negative Spurling test  Shoulders:  Normal range of motion of the shoulders.  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   Smiley Houseman, MD PGY 2 Family Medicine   I observed and examined the patient with the resident and agree with assessment and plan.  Note reviewed and modified by me. Stefanie Libel, MD

## 2016-09-23 NOTE — Assessment & Plan Note (Addendum)
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

## 2016-10-18 ENCOUNTER — Telehealth: Payer: Self-pay

## 2016-10-18 DIAGNOSIS — M542 Cervicalgia: Secondary | ICD-10-CM

## 2016-10-18 MED FILL — ESZOPICLONE 3 MG TABLET: 3 | 30 days supply | Qty: 30 | Fill #2

## 2016-10-18 NOTE — Telephone Encounter (Signed)
Order faxed.

## 2016-10-25 MED FILL — CYANOCOBALAMIN 1,000 MCG/ML: 1000 | 90 days supply | Qty: 3 | Fill #2

## 2016-11-01 DIAGNOSIS — M25512 Pain in left shoulder: Secondary | ICD-10-CM | POA: Diagnosis not present

## 2016-11-01 DIAGNOSIS — M542 Cervicalgia: Secondary | ICD-10-CM | POA: Diagnosis not present

## 2016-11-01 DIAGNOSIS — M25511 Pain in right shoulder: Secondary | ICD-10-CM | POA: Diagnosis not present

## 2016-11-08 DIAGNOSIS — M25511 Pain in right shoulder: Secondary | ICD-10-CM | POA: Diagnosis not present

## 2016-11-08 DIAGNOSIS — M25512 Pain in left shoulder: Secondary | ICD-10-CM | POA: Diagnosis not present

## 2016-11-08 DIAGNOSIS — M542 Cervicalgia: Secondary | ICD-10-CM | POA: Diagnosis not present

## 2016-11-12 DIAGNOSIS — F439 Reaction to severe stress, unspecified: Secondary | ICD-10-CM | POA: Diagnosis not present

## 2016-11-12 DIAGNOSIS — K5289 Other specified noninfective gastroenteritis and colitis: Secondary | ICD-10-CM | POA: Diagnosis not present

## 2016-11-12 DIAGNOSIS — G4700 Insomnia, unspecified: Secondary | ICD-10-CM | POA: Diagnosis not present

## 2016-11-12 MED FILL — ESCITALOPRAM 10 MG TABLET: 10 | 30 days supply | Qty: 30 | Fill #0

## 2016-11-15 MED FILL — ESZOPICLONE 3 MG TABLET: 3 | 90 days supply | Qty: 90 | Fill #0

## 2016-11-18 MED FILL — DIPHENOXYLATE/ATROPINE TAB: 2.5-0.025 | 90 days supply | Qty: 540 | Fill #0

## 2016-11-23 MED FILL — FLUTICASONE PROP 50 MCG SPR: 50 | 30 days supply | Qty: 16 | Fill #1

## 2016-12-13 MED FILL — BUDESONIDE 3 MG CPEP: 3 | 60 days supply | Qty: 180 | Fill #0

## 2016-12-16 DIAGNOSIS — F439 Reaction to severe stress, unspecified: Secondary | ICD-10-CM | POA: Diagnosis not present

## 2016-12-16 DIAGNOSIS — K5289 Other specified noninfective gastroenteritis and colitis: Secondary | ICD-10-CM | POA: Diagnosis not present

## 2016-12-16 DIAGNOSIS — G4700 Insomnia, unspecified: Secondary | ICD-10-CM | POA: Diagnosis not present

## 2016-12-16 MED FILL — FLUoxetine HCL 10 MG CAPS: 10 | 30 days supply | Qty: 60 | Fill #0

## 2017-01-03 DIAGNOSIS — J069 Acute upper respiratory infection, unspecified: Secondary | ICD-10-CM | POA: Diagnosis not present

## 2017-01-14 MED FILL — CYANOCOBALAMIN 1,000 MCG/ML: 1000 | 90 days supply | Qty: 3 | Fill #3

## 2017-01-14 MED FILL — FLUTICASONE PROP 50 MCG SPR: 50 | 30 days supply | Qty: 16 | Fill #2

## 2017-02-10 MED FILL — ESZOPICLONE 3 MG TABS: 3 | 90 days supply | Qty: 90 | Fill #1

## 2017-02-24 MED FILL — FLUTICASONE PROP 50 MCG SPR: 50 | 30 days supply | Qty: 16 | Fill #0

## 2017-04-07 ENCOUNTER — Ambulatory Visit: Payer: 59 | Admitting: Sports Medicine

## 2017-04-07 MED FILL — FLUTICASONE PROP 50 MCG SPR: 50 | 30 days supply | Qty: 16 | Fill #1

## 2017-04-07 MED FILL — DIPHENOXYLATE/ATROPINE TAB: 2.5-0.025 | 90 days supply | Qty: 540 | Fill #1

## 2017-04-29 MED FILL — CYANOCOBALAMIN 1,000 MCG/ML: 1000 | 90 days supply | Qty: 3 | Fill #0

## 2017-05-09 MED FILL — ESZOPICLONE 3 MG TABS: 3 | 90 days supply | Qty: 90 | Fill #0

## 2017-05-17 ENCOUNTER — Encounter: Payer: Self-pay | Admitting: Sports Medicine

## 2017-05-17 ENCOUNTER — Ambulatory Visit (INDEPENDENT_AMBULATORY_CARE_PROVIDER_SITE_OTHER): Payer: No Typology Code available for payment source | Admitting: Sports Medicine

## 2017-05-17 DIAGNOSIS — M25562 Pain in left knee: Secondary | ICD-10-CM | POA: Diagnosis not present

## 2017-05-17 DIAGNOSIS — G8929 Other chronic pain: Secondary | ICD-10-CM | POA: Diagnosis not present

## 2017-05-17 NOTE — Progress Notes (Signed)
   Subjective:    Patient ID: Frank Larson, male    DOB: 1950-06-03, 67 y.o.   MRN: [id number]  HPI  Frank Larson is a 67 yo M presenting with L knee pain.   Began about 3 months ago. 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 He thinks this diagnosis may be contributing to his pain, but also noticed that the pain worsened after buying a new car that is very low to the ground. He also used a new simulated running machine at the gym which seemed to exacerbate his pain. He was using the elliptical as well as the exercise bike at the gym, but is now unable to use these due to pain. He is swimming daily, but has started to have pain when pushing off the wall. Tried to only push off using his L foot, but now is starting to have pain in the same location in the L knee as well. Pain is also worse with walking up and down stairs.  Describes pain as deep and aching and located medial to his patella. Rest improves pain. Has not tried anything else.   Review of Systems MSK: Denies weakness, numbness, tingling of LLE.     Objective:   Physical Exam  Constitutional: He is oriented to person, place, and time. He appears well-developed and well-nourished. No distress.  HENT:  Head: Normocephalic and atraumatic.  Musculoskeletal:  LLE: Minimal TTP medial to joint line. Full active and passive ROM of L knee. No crepitus. No swelling or erythema of the knee. 5/5 strength.   Neurological: He is alert and oriented to person, place, and time.  Psychiatric: He has a normal mood and affect. His behavior is normal.   Ultrasound of left knee   No arthritis changes along joint lines Suprapatellar pouch with no effusion Medial and lateral meniscus unremarkable Trochlear groove shallow with some spurring medially.   Impression: No significant injury noted  on [place]  Ultrasound and interpretation by Peterson Ao B. Raneshia Derick, MD     Assessment & Plan:  Left knee pain Etiology unclear. Likely 2/2 bone spurring noted on ultrasound with shallow trochlear groove a possible contributing factor. No signs of arthritis on ultrasound, making this less likely etiology. Full strength and ROM on exam as well. Will begin trial of conservative treatment with home exercises, which were demonstrated to patient in office today. Discussed gradually returning to elliptical/exercise bike in ~3wks, as pain allows. Would expect improvement in symptoms in 6wks. Return as needed.   Adin Hector, MD, MPH PGY-3 [facility] Pager [phone]  I observed and examined the patient with the resident and agree with assessment and plan.  Note reviewed and modified by me. Stefanie Libel, MD

## 2017-05-17 NOTE — Assessment & Plan Note (Signed)
Etiology unclear. Likely 2/2 bone spurring noted on ultrasound with shallow trochlear groove a possible contributing factor. No signs of arthritis on ultrasound, making this less likely etiology. Full strength and ROM on exam as well. Will begin trial of conservative treatment with home exercises, which were demonstrated to patient in office today. Discussed gradually returning to elliptical/exercise bike in ~3wks, as pain allows. Would expect improvement in symptoms in 6wks. Return as needed.

## 2017-06-07 MED FILL — BUDESONIDE 3 MG CPEP: 3 | 60 days supply | Qty: 180 | Fill #1

## 2017-06-13 ENCOUNTER — Other Ambulatory Visit: Payer: Self-pay

## 2017-06-13 ENCOUNTER — Encounter (HOSPITAL_BASED_OUTPATIENT_CLINIC_OR_DEPARTMENT_OTHER): Payer: Self-pay | Admitting: *Deleted

## 2017-06-13 NOTE — H&P (Signed)
PREOPERATIVE H&P  Chief Complaint: RIGHT FOOT CLOSED METATARSAL FRACTURE S92.309A  HPI: Frank Larson is a 67 y.o. male who presents for preoperative history and physical with a diagnosis of RIGHT FOOT CLOSED METATARSAL FRACTURE S92.309A. Symptoms are rated as moderate to severe, and have been worsening.  This is significantly impairing activities of daily living.  He has elected for surgical management.   Past Medical History:  Diagnosis Date  . Allergy   . Arthritis   . Colitis   . Diverticulosis   . Hyperlipidemia   . Metatarsal fracture    right   Past Surgical History:  Procedure Laterality Date  . COLONOSCOPY  08/07/2002  . INGUINAL HERNIA REPAIR    . NASAL SINUS SURGERY    . NOSE SURGERY     broken nose   Social History   Socioeconomic History  . Marital status: Married    Spouse name: None  . Number of children: 1  . Years of education: None  . Highest education level: None  Social Needs  . Financial resource strain: None  . Food insecurity - worry: None  . Food insecurity - inability: None  . Transportation needs - medical: None  . Transportation needs - non-medical: None  Occupational History  . Occupation: psychologist  Tobacco Use  . Smoking status: Never Smoker  . Smokeless tobacco: Never Used  Substance and Sexual Activity  . Alcohol use: Yes    Alcohol/week: 2.4 oz    Types: 4 Glasses of wine per week    Comment: social  . Drug use: No  . Sexual activity: Yes  Other Topics Concern  . None  Social History Narrative   Daily caffeine----regular exercise            Family History  Problem Relation Age of Onset  . Hyperlipidemia Father   . Heart disease Father   . Peptic Ulcer Disease Father   . Colitis Father   . Parkinson's disease Mother   . Peptic Ulcer Disease Mother   . Colon cancer Neg Hx   . Diabetes Neg Hx    No Known Allergies Prior to Admission medications   Medication Sig Start Date End Date Taking? Authorizing Provider   budesonide (ENTOCORT EC) 3 MG 24 hr capsule TAKE 3 CAPSULES (9 MG TOTAL) BY MOUTH DAILY. 01/23/14  Yes Irene Shipper, MD  cyanocobalamin (,VITAMIN B-12,) 1000 MCG/ML injection INJECT 1ML ONCE MONTHLY 02/26/14  Yes Ricard Dillon, MD  diphenoxylate-atropine (LOMOTIL) 2.5-0.025 MG per tablet TAKE 1 TABLET BY MOUTH FOUR TIMES DAILY AS NEEDED FOR DIARRHEA OR LOOSE STOOLS. 01/01/14  Yes Irene Shipper, MD  Eszopiclone 3 MG TABS Take 3 mg by mouth.   Yes [provider]  fluticasone (FLONASE) 50 MCG/ACT nasal spray Place 2 sprays into both nostrils daily. 01/28/14  Yes Le, Thao P, DO     Positive ROS: All other systems have been reviewed and were otherwise negative with the exception of those mentioned in the HPI and as above.  Physical Exam: General: Alert, no acute distress Cardiovascular: No pedal edema Respiratory: No cyanosis, no use of accessory musculature GI: No organomegaly, abdomen is soft and non-tender Skin: No lesions in the area of chief complaint Neurologic: Sensation intact distally Psychiatric: Patient is competent for consent with normal mood and affect Lymphatic: No axillary or cervical lymphadenopathy  MUSCULOSKELETAL: R foot, ttp lateral 5th metatarsal, NVID  Assessment: RIGHT FOOT CLOSED METATARSAL FRACTURE S92.309A  Plan: Plan for Procedure(s): OPEN REDUCTION  INTERNAL FIXATION (ORIF) OF RIGHT  METATARSAL (TOE) FRACTURE  The risks benefits and alternatives were discussed with the patient including but not limited to the risks of nonoperative treatment, versus surgical intervention including infection, bleeding, nerve injury,  blood clots, cardiopulmonary complications, morbidity, mortality, among others, and they were willing to proceed.   Hiram Gash, MD  06/13/2017 11:40 AM

## 2017-06-15 ENCOUNTER — Ambulatory Visit (HOSPITAL_BASED_OUTPATIENT_CLINIC_OR_DEPARTMENT_OTHER)
Admission: RE | Admit: 2017-06-15 | Payer: No Typology Code available for payment source | Source: Ambulatory Visit | Admitting: Orthopaedic Surgery

## 2017-06-15 HISTORY — DX: Fracture of unspecified metatarsal bone(s), unspecified foot, initial encounter for closed fracture: S92.309A

## 2017-06-15 SURGERY — OPEN REDUCTION INTERNAL FIXATION (ORIF) METATARSAL (TOE) FRACTURE
Anesthesia: Choice | Laterality: Right

## 2017-06-21 MED FILL — FLUTICASONE PROP 50 MCG SPR: 50 | 30 days supply | Qty: 16 | Fill #2

## 2017-07-28 MED FILL — CYANOCOBALAMIN 1,000 MCG/ML: 1000 | 90 days supply | Qty: 3 | Fill #1

## 2017-09-06 MED FILL — DIPHENOXYLATE-ATROPINE 2.5-: 2.5-0.025 | 90 days supply | Qty: 540 | Fill #0

## 2017-09-20 ENCOUNTER — Other Ambulatory Visit: Payer: Self-pay | Admitting: Sports Medicine

## 2017-09-20 ENCOUNTER — Encounter: Payer: Self-pay | Admitting: Sports Medicine

## 2017-09-20 ENCOUNTER — Ambulatory Visit
Admission: RE | Admit: 2017-09-20 | Discharge: 2017-09-20 | Disposition: A | Discharge status: Home / Self Care | Payer: No Typology Code available for payment source | Source: Ambulatory Visit | Attending: Sports Medicine | Admitting: Sports Medicine

## 2017-09-20 ENCOUNTER — Ambulatory Visit: Payer: No Typology Code available for payment source | Admitting: Sports Medicine

## 2017-09-20 VITALS — BP 140/95 | Ht 73.0 in | Wt 148.0 lb

## 2017-09-20 DIAGNOSIS — M25562 Pain in left knee: Principal | ICD-10-CM

## 2017-09-20 DIAGNOSIS — M25511 Pain in right shoulder: Secondary | ICD-10-CM | POA: Diagnosis not present

## 2017-09-20 DIAGNOSIS — M67911 Unspecified disorder of synovium and tendon, right shoulder: Secondary | ICD-10-CM

## 2017-09-20 DIAGNOSIS — G8929 Other chronic pain: Secondary | ICD-10-CM

## 2017-09-20 NOTE — Assessment & Plan Note (Signed)
XR results pending May be some narrowiing laterally in sunrise view Will consider formal PT

## 2017-09-20 NOTE — Progress Notes (Signed)
Subjective:    Frank Larson is a 67 y.o. old male here with right knee and right shoulder pain.  HPI  Left knee pain: Chronic issue.  Reports history of chondromalacia in his 31s when he used to run marathons. He stopped running. He was pain free for over 40 years. Then, he starting exercising using a running simulator last summer and his left knee started hurting again.  Was seen in clinic about  4 months ago he.  He was recommended home exercise regimen which didn't help with pain.  So he stopped the home exercise.  Pain is gradually getting worse.  He localizes the pain to anterior aspects below his knee cap. Pain is worse with climbing stairs and also going down stairs. Also pain with breaststroke and pushing of the wall during swimming.  Denies trauma or injury.  Denies locking or catching.  Denies swelling.  He says he has not been able to do running for the last 6 to 8 months.  He has been trying bike with a little resistance.   Of note, patient later admitted right metatarsal fracture about 3 months ago and was in a walking boot for quite some time.  Right shoulder pain: Chronic issue for years but gotten worse for the last 6 weeks.  Denies trauma or injury.  He says he has been trying to swim more as he was not able to do running due to his right knee pain.  Denies history of trauma or injury.  Pain is over the superior aspect of his shoulder.  Denies radiation. PMH/Problem List: has HYPERLIPIDEMIA; SINUSITIS, CHRONIC; ALLERGIC RHINITIS; COLITIS; HEMATOSPERMIA; Pain in joint, shoulder region; elbow pain left; Osteoarthritis of spine; Neck pain; ARM PAIN; CAVUS DEFORMITY OF FOOT, ACQUIRED; TALIPES CAVUS; INSOMNIA; DIARRHEA; MUSCLE STRAIN, HAMSTRING MUSCLE; Gastrocnemius muscle strain; Inguinal pain of both sides; Tendinopathy of rotator cuff; Gluteus medius or minimus syndrome; Medial epicondylitis of both elbows; Left knee pain; and Chronic right shoulder pain on their problem list.   has a past  medical history of Allergy, Arthritis, Colitis, Diverticulosis, Hyperlipidemia, and Metatarsal fracture.  FH:  Family History  Problem Relation Age of Onset  . Hyperlipidemia Father   . Heart disease Father   . Peptic Ulcer Disease Father   . Colitis Father   . Parkinson's disease Mother   . Peptic Ulcer Disease Mother   . Colon cancer Neg Hx   . Diabetes Neg Hx     SH Social History   Tobacco Use  . Smoking status: Never Smoker  . Smokeless tobacco: Never Used  Substance Use Topics  . Alcohol use: Yes    Alcohol/week: 2.4 oz    Types: 4 Glasses of wine per week    Comment: social  . Drug use: No    Review of Systems Review of systems negative except for pertinent positives and negatives in history of present illness above.     Objective:     Vitals:   09/20/17 1523  BP: (!) 140/95  Weight: 148 lb (67.1 kg)  Height: 6' 1"  (1.854 m)   Body mass index is 19.53 kg/m.  Physical Exam  GEN: appears well & comfortable. No apparent distress. MSK:  Left knee Normal to inspection with no erythema or effusion or obvious bony abnormalities. No warmth to touch. Has joint line tenderness right behind patellar tendon bilaterally.  No tenderness over the patellar tendon itself. ROM full in flexion and extension and lower leg rotation. Patellar glide with crepitus. Ligaments with  solid consistent endpoints including ACL, PCL, LCL, MCL. Some pain with patellar compression test. No significant McMurray sign Antalgic gait  Normal sensation in right leg, foot.   Neurovascularly intact with good distal pulses.  Right Shoulder: Inspection reveals no abnormalities, atrophy or asymmetry except for some bony prominence over right AC joint. Palpation with tenderness over AC joint ROM is full in all planes. Rotator cuff strength normal and painless throughout. No signs of impingement with negative Hawkin's tests & empty can sign. O'Brien test positive Neurovascular  intact PSYCH: euthymic mood with congruent affect  Ultrasound Bedside ultrasound of the left knee revealed osteophytes over the medial end of his trochlear groove.  Both medial lateral menisci appear intact.  No effusion noted.Good joint space.  Bedside ultrasound of his right shoulder revealed narrowing of his AC joint.  No obvious osteophytes noted. Calcification within the supraspinatus tendon noted again.  Ultrasound and interpretation by Frank Larson. Frank Mollenkopf, MD     Assessment and Plan:  1. Chronic pain of left knee: exam with some joint line tenderness right behind the patellar tendon but patient without significant arthritic changes on bedside ultrasound.  He also have positive compression test concerning for some arthritis behind his patella.  He may have some osteophytes over the medial end of his trochlear groove as noted on the bedside ultrasound.  Will obtain DG left knee AP, lateral and sunrise for better assessment.  We will plan follow-up after x-ray results.  2. Chronic right shoulder pain: likely due to [facility] joint arthritis.  He has focal tenderness over AC joint.  He has positive O'Brien test as well.  Bedside ultrasound revealed AC joint narrowing without significant osteophytes.  Recommended icing after each exercise and trying over-the-counter pain medications. Recommended against activities that can cause significant adduction at his shoulder.  Frank Riding, MD 09/20/17 Pager: [phone]  I observed and examined the patient with the resident and agree with assessment and plan.  Note reviewed and modified by me. Frank Libel, MD

## 2017-09-20 NOTE — Assessment & Plan Note (Signed)
Still with calcific changes SST but no tear today This does not seem source of apin

## 2017-09-27 ENCOUNTER — Ambulatory Visit: Payer: Medicare Other | Admitting: Sports Medicine

## 2017-09-27 ENCOUNTER — Encounter

## 2017-10-24 MED FILL — FLUTICASONE PROP 50 MCG SPR: 50 | 30 days supply | Qty: 16 | Fill #3

## 2017-10-31 MED FILL — CYANOCOBALAMIN 1,000 MCG/ML: 1000 | 90 days supply | Qty: 3 | Fill #2

## 2017-12-06 MED FILL — predniSONE 5 MG (21) TBPK: 5 | 6 days supply | Qty: 21 | Fill #0

## 2017-12-06 MED FILL — hydrOXYzine HCL 50 MG TABS: 50 | 5 days supply | Qty: 20 | Fill #0

## 2017-12-06 MED FILL — BUDESONIDE 3 MG CPEP: 3 | 60 days supply | Qty: 180 | Fill #2

## 2018-01-19 MED FILL — DIPHENOXYLATE-ATROPINE 2.5-: 2.5-0.025 | 90 days supply | Qty: 540 | Fill #1

## 2018-01-19 MED FILL — FLUTICASONE PROP 50 MCG SPR: 50 | 30 days supply | Qty: 16 | Fill #4

## 2018-01-24 MED FILL — CYANOCOBALAMIN 1,000 MCG/ML: 1000 | 90 days supply | Qty: 3 | Fill #3

## 2018-02-01 ENCOUNTER — Telehealth: Payer: Self-pay | Admitting: Internal Medicine

## 2018-02-13 NOTE — Telephone Encounter (Signed)
See note below from Dr. Henrene Pastor regarding colon.

## 2018-02-13 NOTE — Telephone Encounter (Signed)
Outside records reviewed.  Okay for direct colonoscopy with me in [place] to evaluate status of microscopic colitis

## 2018-02-14 ENCOUNTER — Encounter: Payer: Self-pay | Admitting: Internal Medicine

## 2018-02-14 NOTE — Telephone Encounter (Signed)
Pt scheduled for colon and previsit.  Patient notified and letter mailed

## 2018-02-17 ENCOUNTER — Telehealth: Payer: Self-pay | Admitting: Internal Medicine

## 2018-02-17 NOTE — Telephone Encounter (Signed)
Dr. Inda Merlin called wanting to know if pt had been scheduled for colon yet. Let Dr. Inda Merlin know that Frank Larson is scheduled for colon 03/31/18.

## 2018-03-17 ENCOUNTER — Ambulatory Visit (AMBULATORY_SURGERY_CENTER): Payer: Self-pay | Admitting: *Deleted

## 2018-03-17 ENCOUNTER — Encounter: Payer: Self-pay | Admitting: Internal Medicine

## 2018-03-17 VITALS — Ht 73.0 in | Wt 150.0 lb

## 2018-03-17 DIAGNOSIS — K52832 Lymphocytic colitis: Secondary | ICD-10-CM

## 2018-03-17 MED ORDER — NA SULFATE-K SULFATE-MG SULF 17.5-3.13-1.6 GM/177ML PO SOLN: 1.0000 | Freq: Once | ORAL | 0 refills | Status: AC

## 2018-03-17 MED FILL — SUPREP BOWEL PREP KIT: 17.5-3.13-1 | 1 days supply | Qty: 354 | Fill #0

## 2018-03-17 MED FILL — FLUTICASONE PROP 50 MCG SPR: 50 | 30 days supply | Qty: 16 | Fill #0

## 2018-03-17 NOTE — Progress Notes (Signed)
No egg or soy allergy known to patient  No issues with past sedation with any surgeries  or procedures, no intubation problems  No diet pills per patient No home 02 use per patient  No blood thinners per patient  Pt denies issues with constipation  No A fib or A flutter  EMMI video sent to pt's e mail - pt declined

## 2018-03-31 ENCOUNTER — Encounter: Payer: Self-pay | Admitting: Internal Medicine

## 2018-03-31 ENCOUNTER — Ambulatory Visit (AMBULATORY_SURGERY_CENTER): Payer: No Typology Code available for payment source | Admitting: Internal Medicine

## 2018-03-31 VITALS — BP 145/82 | HR 54 | Temp 98.0°F | Resp 13 | Ht 73.0 in | Wt 150.0 lb

## 2018-03-31 DIAGNOSIS — K52832 Lymphocytic colitis: Secondary | ICD-10-CM

## 2018-03-31 DIAGNOSIS — R197 Diarrhea, unspecified: Secondary | ICD-10-CM

## 2018-03-31 MED ORDER — SODIUM CHLORIDE 0.9 % IV SOLN: 500.0000 mL | Freq: Once | INTRAVENOUS | Status: DC

## 2018-03-31 NOTE — Patient Instructions (Signed)
Discharge instructions given. Handout on Diverticulosis. Resume previous medications. YOU HAD AN ENDOSCOPIC PROCEDURE TODAY AT [facility]:   Refer to the procedure report that was given to you for any specific questions about what was found during the examination.  If the procedure report does not answer your questions, please call your gastroenterologist to clarify.  If you requested that your care partner not be given the details of your procedure findings, then the procedure report has been included in a sealed envelope for you to review at your convenience later.  YOU SHOULD EXPECT: Some feelings of bloating in the abdomen. Passage of more gas than usual.  Walking can help get rid of the air that was put into your GI tract during the procedure and reduce the bloating. If you had a lower endoscopy (such as a colonoscopy or flexible sigmoidoscopy) you may notice spotting of blood in your stool or on the toilet paper. If you underwent a bowel prep for your procedure, you may not have a normal bowel movement for a few days.  Please Note:  You might notice some irritation and congestion in your nose or some drainage.  This is from the oxygen used during your procedure.  There is no need for concern and it should clear up in a day or so.  SYMPTOMS TO REPORT IMMEDIATELY:   Following lower endoscopy (colonoscopy or flexible sigmoidoscopy):  Excessive amounts of blood in the stool  Significant tenderness or worsening of abdominal pains  Swelling of the abdomen that is new, acute  Fever of 100F or higher   For urgent or emergent issues, a gastroenterologist can be reached at any hour by calling [phone].   DIET:  We do recommend a small meal at first, but then you may proceed to your regular diet.  Drink plenty of fluids but you should avoid alcoholic beverages for 24 hours.  ACTIVITY:  You should plan to take it easy for the rest of today and you should NOT DRIVE or use  heavy machinery until tomorrow (because of the sedation medicines used during the test).    FOLLOW UP: Our staff will call the number listed on your records the next business day following your procedure to check on you and address any questions or concerns that you may have regarding the information given to you following your procedure. If we do not reach you, we will leave a message.  However, if you are feeling well and you are not experiencing any problems, there is no need to return our call.  We will assume that you have returned to your regular daily activities without incident.  If any biopsies were taken you will be contacted by phone or by letter within the next 1-3 weeks.  Please call us at [phone] if you have not heard about the biopsies in 3 weeks.    SIGNATURES/CONFIDENTIALITY: You and/or your care partner have signed paperwork which will be entered into your electronic medical record.  These signatures attest to the fact that that the information above on your After Visit Summary has been reviewed and is understood.  Full responsibility of the confidentiality of this discharge information lies with you and/or your care-partner.

## 2018-03-31 NOTE — Progress Notes (Signed)
Called to room to assist during endoscopic procedure.  Patient ID and intended procedure confirmed with present staff. Received instructions for my participation in the procedure from the performing physician.  

## 2018-03-31 NOTE — Progress Notes (Signed)
Report to PACU, RN, vss, BBS= Clear.  

## 2018-03-31 NOTE — Op Note (Signed)
[facility] Patient Name: Frank Larson Procedure Date: 03/31/2018 8:17 AM MRN: [id number] Endoscopist: Docia Chuck. Henrene Pastor , MD Age: 68 Referring MD:  Date of Birth: Aug 02, 1950 Gender: Male Account #: [id number] Procedure:                Colonoscopy with biopsies Indications:              SurveillanceMicroscopic colitis, Chronic diarrhea.                            The patient has biopsy-proven lymphocytic colitis                            on colonoscopy May 2004 and again May 2014. Has                            been on chronic budesonide therapy. Has been                            followed at [facility] where biologic therapy                            is currently being contemplated. Repeat colonoscopy                            with biopsies requested Medicines:                Monitored Anesthesia Care Procedure:                Pre-Anesthesia Assessment:                           - Prior to the procedure, a History and Physical                            was performed, and patient medications and                            allergies were reviewed. The patient's tolerance of                            previous anesthesia was also reviewed. The risks                            and benefits of the procedure and the sedation                            options and risks were discussed with the patient.                            All questions were answered, and informed consent                            was obtained. Prior Anticoagulants: The patient has  taken no previous anticoagulant or antiplatelet                            agents. ASA Grade Assessment: II - A patient with                            mild systemic disease. After reviewing the risks                            and benefits, the patient was deemed in                            satisfactory condition to undergo the procedure.                           After obtaining informed  consent, the colonoscope                            was passed under direct vision. Throughout the                            procedure, the patient's blood pressure, pulse, and                            oxygen saturations were monitored continuously. The                            Model CF-HQ190L [id number]) scope was introduced                            through the anus and advanced to the the cecum,                            identified by appendiceal orifice and ileocecal                            valve. The ileocecal valve, appendiceal orifice,                            and rectum were photographed. The quality of the                            bowel preparation was excellent. The colonoscopy                            was performed without difficulty. The patient                            tolerated the procedure well. The bowel preparation                            used was SUPREP. Scope In: 8:24:41 AM Scope Out: 8:37:08 AM Scope Withdrawal Time: 0 hours 10 minutes 18 seconds  Total Procedure Duration: 0 hours  12 minutes 27 seconds  Findings:                 Multiple medium-mouthed diverticula were found in                            the left colon.                           The entire examined colon appeared normal on direct                            and retroflexion views. Biopsies for histology were                            taken with a cold forceps from the entire colon for                            evaluation of microscopic colitis. Complications:            No immediate complications. Estimated blood loss:                            None. Estimated Blood Loss:     Estimated blood loss: none. Impression:               - Diverticulosis in the left colon.                           - The entire examined colon is normal on direct and                            retroflexion views. Recommendation:           - Repeat colonoscopy in 10 years for screening                             purposes.                           - Patient has a contact number available for                            emergencies. The signs and symptoms of potential                            delayed complications were discussed with the                            patient. Return to normal activities tomorrow.                            Written discharge instructions were provided to the                            patient.                           -  Resume previous diet.                           - Continue present medications.                           - Await pathology results.                           - Return to [facility] for ongoing management                            of your microscopic colitis Docia Chuck. Henrene Pastor, MD 03/31/2018 8:49:36 AM This report has been signed electronically.

## 2018-03-31 NOTE — Progress Notes (Signed)
Pt's states no medical or surgical changes since previsit or office visit. 

## 2018-04-03 ENCOUNTER — Telehealth: Payer: Self-pay

## 2018-04-03 ENCOUNTER — Telehealth: Payer: Self-pay | Admitting: Internal Medicine

## 2018-04-03 NOTE — Telephone Encounter (Signed)
  Follow up Call-  Call back number 03/31/2018  Post procedure Call Back phone  # [phone]  Permission to leave phone message Yes  Some recent data might be hidden     Left message

## 2018-04-03 NOTE — Telephone Encounter (Signed)
Hi, this pt just called requesting to speak with the nurse that he had his pre-visit with. He did not want to give me more details. Could you please call him? Thank you.

## 2018-04-03 NOTE — Telephone Encounter (Signed)
Pt's insurance told him they have had no contact with our practice about his colon April 04, 2018.  He was coded as lymphocytic colitis.  They told him we need to code as screen for it to be covered. Pt is concerned because the insurance said no contact. Can you call him .Marland Kitchen

## 2018-04-03 NOTE — Telephone Encounter (Signed)
  Follow up Call-  Call back number 03/31/2018  Post procedure Call Back phone  # [phone]  Permission to leave phone message Yes  Some recent data might be hidden     Left message

## 2018-04-04 ENCOUNTER — Ambulatory Visit: Payer: No Typology Code available for payment source | Admitting: Sports Medicine

## 2018-04-04 VITALS — BP 140/84 | Ht 73.0 in | Wt 150.0 lb

## 2018-04-04 DIAGNOSIS — M65262 Calcific tendinitis, left lower leg: Secondary | ICD-10-CM | POA: Diagnosis not present

## 2018-04-04 NOTE — Telephone Encounter (Signed)
Called pt today to discuss concerns about notifying his insurance.  I told him that as of January 1st, colonoscopies did not require a precert, and that if a screening diagnosis code was not used, his procedure would fall under his copay. I also told him that all insurances do not guarantee how they will pay until they process the claim.  He understood and thanked me for the call.

## 2018-04-04 NOTE — Progress Notes (Signed)
HPI  CC: Left knee pain  Frank Larson is a 68 year old male who presents for follow-up of left knee pain.  He was previously seen on 09/20/2017.  At that time he was given some home exercises to work on for strengthening of the quad tendon.  He states he has been doing those since that time.  Recall, he has had this pain for around 18 months.  He does not recall an inciting event.  He states that it did come on when he started doing a running treadmill around 18 months ago.  He has been doing the elliptical and stationary bike since that time.  He states that this does make the pain  Somewhat worse if not careful.  He states the pain is over the inferior edge of his patella.  He states going up stairs does make it worse.  He has not tried any medications.  He denies any numbness and tingling in his leg.  Denies any locking catching of the knee.  He denies any weakness of the leg.  He has no prior injury to this knee.  Soc.  Wife finishing chemotherapy for breast CA Daughter at [facility]  Past Hx:  Calcific tendinopathy of RT shoulder  See HPI and/or previous note for associated ROS.  Objective: BP 140/84   Ht 6' 1"  (1.854 m)   Wt 150 lb (68 kg)   BMI 19.79 kg/m  Gen: NAD, well groomed, a/o x3, normal affect.  CV: Well-perfused. Warm.  Resp: Non-labored.  Neuro: Sensation intact throughout. No gross coordination deficits.  Gait: Nonpathologic posture, unremarkable stride without signs of limp or balance issues.  Left knee exam: No erythema, warmth, swelling noted.  Tenderness palpation along the patellar tendon at the attachment at the inferior patella.  Full range of motion knee extension knee flexion.  Strength out of 5 throughout testing.  Negative ligamentous testing.  ULTRASOUND: Knee, right Diagnostic limited ultrasound imaging obtained of patient's right knee.  - Quadriceps tendon: No appreciated signs of tearing or edema.  Calcification is noted along the quadriceps tendon as it attaches to  the superior edge of patella.  No (compressible) fluid/edema noted within the suprapatellar pouch.  - Patellar tendon: No signs of tearing or edema.  Small calcification noted along the patellar tendon attachment to the inferior edge of patella..  Calcification noted on the lateral aspect of the patella on the undersurface. Joint space preserved and meniscus looks intact at periphery  IMPRESSION: findings consistent with calcific tendinopathy of the quadricep tendon, calcific tendinopathy of the patellar tendon.  Ultrasound and interpretation by Ileene Patrick, MD and [name]. 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  We discussed treatment options at today's visit.  We will have him do some activity modification at this time to help reduce the pain.  We will try him on a recumbent bike to see if that causes less pain than a traditional stand-up bike.  We will also have him work on quad extensions to work on Hotel manager.  Focus on isolating VMO.  We will have him continue with the same exercise regiment that was provided to him at last visit.  We will have him stop the wall squats, however these may have made it worse.  If he gets no benefit on these measures, I will consider starting formal physical therapy.  [name], MD [facility] Fellow 04/04/2018 1:37 PM   I observed and examined the  patient with the [facility] Fellow and agree with assessment and plan.  Note reviewed and modified by me. Stefanie Libel, MD

## 2018-04-05 ENCOUNTER — Ambulatory Visit: Payer: Self-pay

## 2018-04-05 DIAGNOSIS — M65262 Calcific tendinitis, left lower leg: Secondary | ICD-10-CM

## 2018-04-05 NOTE — Addendum Note (Signed)
Addended by: Cyd Silence on: 04/05/2018 11:04 AM   Modules accepted: Orders

## 2018-04-07 ENCOUNTER — Encounter: Payer: Self-pay | Admitting: Internal Medicine

## 2018-04-27 ENCOUNTER — Other Ambulatory Visit (INDEPENDENT_AMBULATORY_CARE_PROVIDER_SITE_OTHER): Payer: No Typology Code available for payment source

## 2018-04-27 ENCOUNTER — Encounter: Payer: Self-pay | Admitting: Internal Medicine

## 2018-04-27 ENCOUNTER — Telehealth: Payer: Self-pay

## 2018-04-27 ENCOUNTER — Ambulatory Visit: Payer: No Typology Code available for payment source | Admitting: Internal Medicine

## 2018-04-27 VITALS — BP 130/74 | HR 82 | Ht 73.0 in | Wt 147.0 lb

## 2018-04-27 DIAGNOSIS — R159 Full incontinence of feces: Secondary | ICD-10-CM

## 2018-04-27 DIAGNOSIS — K52832 Lymphocytic colitis: Secondary | ICD-10-CM

## 2018-04-27 DIAGNOSIS — R197 Diarrhea, unspecified: Secondary | ICD-10-CM

## 2018-04-27 LAB — CBC WITH DIFFERENTIAL/PLATELET
BASOS PCT: 0.9 % (ref 0.0–3.0)
Basophils Absolute: 0 10*3/uL (ref 0.0–0.1)
EOS ABS: 0.1 10*3/uL (ref 0.0–0.7)
EOS PCT: 1.6 % (ref 0.0–5.0)
HCT: 41.7 % (ref 39.0–52.0)
HEMOGLOBIN: 14.6 g/dL (ref 13.0–17.0)
LYMPHS ABS: 1.2 10*3/uL (ref 0.7–4.0)
Lymphocytes Relative: 25.7 % (ref 12.0–46.0)
MCHC: 35 g/dL (ref 30.0–36.0)
MCV: 90.4 fl (ref 78.0–100.0)
MONO ABS: 0.6 10*3/uL (ref 0.1–1.0)
Monocytes Relative: 12.6 % — ABNORMAL HIGH (ref 3.0–12.0)
NEUTROS PCT: 59.2 % (ref 43.0–77.0)
Neutro Abs: 2.8 10*3/uL (ref 1.4–7.7)
PLATELETS: 246 10*3/uL (ref 150.0–400.0)
RBC: 4.61 Mil/uL (ref 4.22–5.81)
RDW: 13 % (ref 11.5–15.5)
WBC: 4.7 10*3/uL (ref 4.0–10.5)

## 2018-04-27 LAB — COMPREHENSIVE METABOLIC PANEL
ALBUMIN: 4.2 g/dL (ref 3.5–5.2)
ALT: 16 U/L (ref 0–53)
AST: 18 U/L (ref 0–37)
Alkaline Phosphatase: 42 U/L (ref 39–117)
BUN: 16 mg/dL (ref 6–23)
CHLORIDE: 104 meq/L (ref 96–112)
CO2: 27 mEq/L (ref 19–32)
CREATININE: 0.78 mg/dL (ref 0.40–1.50)
Calcium: 9.7 mg/dL (ref 8.4–10.5)
GFR: 99.25 mL/min (ref >60.00)
GLUCOSE: 86 mg/dL (ref 70–99)
Potassium: 4.2 mEq/L (ref 3.5–5.1)
SODIUM: 138 meq/L (ref 135–145)
TOTAL PROTEIN: 7.1 g/dL (ref 6.0–8.3)
Total Bilirubin: 1.3 mg/dL — ABNORMAL HIGH (ref 0.2–1.2)

## 2018-04-27 LAB — HIGH SENSITIVITY CRP: CRP HIGH SENSITIVITY: 0.73 mg/L (ref 0.000–5.000)

## 2018-04-27 MED FILL — CYANOCOBALAMIN 1,000 MCG/ML: 1000 | 84 days supply | Qty: 3 | Fill #0

## 2018-04-27 NOTE — Patient Instructions (Signed)
Your provider has requested that you go to the basement level for lab work before leaving today. Press "B" on the elevator. The lab is located at the first door on the left as you exit the elevator.

## 2018-04-27 NOTE — Telephone Encounter (Signed)
Humira information faxed to [employer] to verify coverage.

## 2018-04-27 NOTE — Progress Notes (Signed)
HISTORY OF PRESENT ILLNESS:  Frank Larson is a 68 y.o. male who reestablishes with this office after a long hiatus regarding management of refractory lymphocytic colitis.  The patient was initially diagnosed with lymphocytic colitis on colonoscopy with biopsies May 2004.  Repeat colonoscopy with biopsies May 2014 revealed the same.  His ileum was normal.  Work-up for celiac disease was negative.  Initially treated with Entocort and Lomotil.  The results have been variable.  He has been lost to follow-up since September 2014.  He tells me that he was receiving budesonide from his primary care provider and taking a dose of 6 mg twice daily.  At one point he tells me that he stopped the drug after long-term usage and developed adrenal insufficiency type symptoms.  He has been using Lomotil chronically, typically 4 daily.  More recently has been on budesonide 3 mg daily in addition to Lomotil.  He was seen in [place] on at least 3 occasions (outside records reviewed) August 2017, November 2017, and most recently October 2019 by Dr. Erven Colla.  Patient tells me that he has never had a significant response to his therapies.  Dr. Drema Dallas discussed biologic therapy in the form of Entyvio (vedolizumab) a selective adhesion molecule inhibitor given intravenously.  Reasons for considering this therapy included disease refractory to steroids, intermittent problems with significant incontinence, easy bruisability on his skin.  Prior to considering Biologics, colonoscopy was requested and performed March 31, 2018.  Examination was grossly normal except for left-sided diverticulosis.  However, random colon biopsies from the right and left colon revealed lymphocytic colitis.  He reestablishes with this office to discuss treatment options including Biologics.  Blood work with his PCP (which came in after he left the office) from April 13, 2018 shows unremarkable CBC and comprehensive metabolic panel.  Patient has  multiple appropriate questions and concerns regarding different therapies, their effects, efficacies, cost, and side effects.  The patient does tell me that he has been vaccinated for shingles.  REVIEW OF SYSTEMS:  All non-GI ROS negative unless otherwise stated in the HPI except for sinus and allergy, hearing problems, sleeping problems  Past Medical History:  Diagnosis Date  . Allergy   . Arthritis   . Colitis   . Diverticulosis   . Hyperlipidemia    off medicines currently   . Metatarsal fracture    right    Past Surgical History:  Procedure Laterality Date  . COLONOSCOPY  08/07/2002  . INGUINAL HERNIA REPAIR    . NASAL SINUS SURGERY    . NOSE SURGERY     broken nose    Social History Frank Larson  reports that he has never smoked. He has never used smokeless tobacco. He reports current alcohol use of about 4.0 standard drinks of alcohol per week. He reports that he does not use drugs.  family history includes Colitis in his father; Heart disease in his father; Hyperlipidemia in his father; Parkinson's disease in his mother; Peptic Ulcer Disease in his father and mother.  No Known Allergies     PHYSICAL EXAMINATION: Vital signs: BP 130/74   Pulse 82   Ht 6' 1"  (1.854 m)   Wt 147 lb (66.7 kg)   BMI 19.39 kg/m   Constitutional: generally well-appearing, no acute distress Psychiatric: alert and oriented x3, cooperative Eyes: extraocular movements intact, anicteric, conjunctiva pink Mouth: oral pharynx moist, no lesions Neck: supple no lymphadenopathy Cardiovascular: heart regular rate and rhythm, no murmur Lungs: clear to  auscultation bilaterally Abdomen: soft, nontender, nondistended, no obvious ascites, no peritoneal signs, normal bowel sounds, no organomegaly Rectal: Omitted Extremities: no clubbing, cyanosis, or lower extremity edema bilaterally Skin: Scattered ecchymoses.  Otherwise no lesions on visible extremities Neuro: No focal deficits.  Cranial nerves  intact  ASSESSMENT:  1.  Refractory lymphocytic colitis with chronic diarrhea, incontinence, and budesonide medication side effects.   PLAN:  1.  I had an incredibly comprehensive discussion with him regarding microscopic colitis.  We focused on refractory cases.  We discussed the pros and cons as well as shortcomings and limited knowledge on multiple entities including budesonide, prednisone, cholestyramine, bismuth, immunomodulators, anti-TNF Biologics, and Entyvio.  We even discussed surgery.  A great deal of time was spent focusing on the biologic therapies.  I explained to him that the data available is quite limited.  I reviewed with him several studies from up-to-date.  There was limited data to suggest anti-TNF's were helpful in refractory cases.  I could not find information on Entyvio, though it may exist.  Patient would prefer self injecting therapy such as Humira as he is used to giving himself B12 shots.  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  We discussed principal side effects including infections and cancer.  Also dermatologic, rheumatologic, and neurologic side effects.  Also drug reactions.  He will be provided with literature on Humira.  In addition to the previously obtained labs we will obtain TB testing and C-reactive protein today.  I would like to see him back in a few weeks after he has had time to review the literature on Humira.  My nurse will look into his coverage as well as nurse ambassador assistance regarding proper drug usage.  We also discussed slow taper of his low-dose budesonide to avoid any adrenal issues.  At this point he will continue on his low-dose budesonide and Lomotil without change.  60-minute spent face-to-face with the patient.  Greater than 50% the time used for counseling regarding management of his complicated refractory lymphocytic colitis.

## 2018-04-27 NOTE — Telephone Encounter (Signed)
-----   Message from Irene Shipper, MD sent at 04/27/2018  1:35 PM EST ----- Regarding: Insurance coverage for Humira Linda,Considering Humira therapy on this patient in the near future.  Please check and see if this is covered by his insurance.  The reason is refractory colitis.  Thanks

## 2018-04-29 LAB — QUANTIFERON-TB GOLD PLUS
NIL: 0.02 IU/mL
QUANTIFERON-TB GOLD PLUS: NEGATIVE
TB1-NIL: 0 [IU]/mL
TB2-NIL: <0 IU/mL

## 2018-05-01 ENCOUNTER — Telehealth: Payer: Self-pay | Admitting: Internal Medicine

## 2018-05-01 NOTE — Telephone Encounter (Signed)
Pt calling regarding entyvio and humira treatment. Pt states that on his last visit with Dr. Henrene Pastor he was told that Dr. Blanch Media nurse was going to look into coverage for both options, pt would like to compare prices to make a decision. Pls call him.

## 2018-05-02 NOTE — Telephone Encounter (Signed)
Let pt know that we are waiting to hear back from [employer], the PA was submitted today for Humira and we should hear something back within a day or 2 and we will let him know. Dr. Henrene Pastor did not have me check on Entyvio.

## 2018-05-05 ENCOUNTER — Telehealth: Payer: Self-pay | Admitting: Internal Medicine

## 2018-05-05 NOTE — Telephone Encounter (Signed)
error 

## 2018-05-12 ENCOUNTER — Telehealth: Payer: Self-pay | Admitting: Internal Medicine

## 2018-05-12 NOTE — Telephone Encounter (Signed)
[facility] call to advised that the pt humira will be transferred [facility]. Nothing needed to be done on our end. They are transferring it.

## 2018-05-15 ENCOUNTER — Telehealth: Payer: Self-pay | Admitting: Pharmacist

## 2018-05-15 NOTE — Telephone Encounter (Signed)
Called patient to schedule an appointment for the [facility]. I was unable to reach the patient so I left a HIPAA-compliant message requesting that the patient return my call.

## 2018-05-16 ENCOUNTER — Telehealth: Payer: Self-pay | Admitting: Internal Medicine

## 2018-05-16 NOTE — Telephone Encounter (Signed)
Pt called in wanting to speak with the nurse in regards to his new medication humira that he will be starting.

## 2018-05-16 NOTE — Telephone Encounter (Signed)
[name] has been approved for Humira for a 5.00 copay with his insurance. He has spoken to his PCP regarding starting the Humira and is ready to start. He called and wants to know if he needs to come in for an OV with Dr. Henrene Pastor again prior to starting the Humira. Please advise.

## 2018-05-16 NOTE — Telephone Encounter (Signed)
We went over biologic therapy in great detail (as well as other therapies) during his most recent office visit.  He was also given extensive literature for review thereafter.  If he would like to come in to see me prior to starting therapy, that would be fine.  If he does not feel the need and feels comfortable moving forward with therapy, that is fine.  If he does proceed without an office visit, then I would like to see him for follow-up 2 months after he begins Humira.  Thanks

## 2018-05-16 NOTE — Telephone Encounter (Signed)
Spoke with pt and he is aware and will go ahead and proceed with the Humira. Nurse ambassador form faxed and pt will call back to schedule OV for May with Dr. Henrene Pastor.

## 2018-05-22 ENCOUNTER — Ambulatory Visit (INDEPENDENT_AMBULATORY_CARE_PROVIDER_SITE_OTHER): Payer: No Typology Code available for payment source | Admitting: Sports Medicine

## 2018-05-22 VITALS — BP 141/73 | Ht 73.0 in | Wt 149.0 lb

## 2018-05-22 DIAGNOSIS — M25511 Pain in right shoulder: Secondary | ICD-10-CM

## 2018-05-22 DIAGNOSIS — G8929 Other chronic pain: Secondary | ICD-10-CM

## 2018-05-22 MED ORDER — METHYLPREDNISOLONE ACETATE 40 MG/ML IJ SUSP
40.0000 mg | Freq: Once | INTRAMUSCULAR | Status: AC
  Administered 2018-05-22: 40 mg via INTRA_ARTICULAR

## 2018-05-23 ENCOUNTER — Telehealth: Payer: Self-pay

## 2018-05-23 ENCOUNTER — Encounter: Payer: Self-pay | Admitting: Sports Medicine

## 2018-05-23 DIAGNOSIS — G8929 Other chronic pain: Secondary | ICD-10-CM

## 2018-05-23 DIAGNOSIS — M25511 Pain in right shoulder: Principal | ICD-10-CM

## 2018-05-23 DIAGNOSIS — M67911 Unspecified disorder of synovium and tendon, right shoulder: Secondary | ICD-10-CM

## 2018-05-23 NOTE — Progress Notes (Signed)
   Subjective:    Patient ID: ARISTOTLE LIEB, male    DOB: [birth year], 68 y.o.   MRN: [id number]  HPI chief complaint: Right shoulder pain  Very pleasant right-hand-dominant 68 year old male comes in today complaining of chronic right shoulder pain.  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  His symptoms became acutely worse over the weekend after he was doing some work around the house.  He localizes his pain to the lateral aspect of his shoulder.  It is most noticeable when reaching directly overhead or away from his body.  He also gets significant nighttime pain.  He has not noticed any weakness.  He admits that he has had chronic shoulder issues for about 15 years.  He was an avid swimmer but had to stop because of his shoulder pain.  He does endorse some pain in his left shoulder but it is not as severe as the right.  Past medical history reviewed Medications reviewed Allergies reviewed    Review of Systems As above    Objective:   Physical Exam  Well-developed, well-nourished.  No acute distress.  Awake alert and oriented x3.  Vital signs reviewed  Right shoulder: Good range of motion with a positive painful arc.  Minimal tenderness to palpation over the acromioclavicular joint.  He is most tender to palpation along the lateral shoulder deep to the deltoid at the insertion of the rotator cuff.  Mildly positive empty can.  Positive Hawkins.  4+/5 strength with resisted external rotation on the right compared to the left.  Remainder of his rotator cuff strength is 5/5 but he does have reproducible pain with resisted supraspinatus.  Good subscapularis strength.  Negative O'Brien's.  Moderate atrophy of both the supraspinatus and infraspinatus muscle bellies.  Neurovascularly intact distally.      Assessment & Plan:   Right shoulder pain secondary to calcific rotator cuff tendinopathy  Subacromial  cortisone injection was administered today.  Patient tolerated this without difficulty.  He is already doing scapular stabilization exercises so we will add Jobe exercises to this as well.  Follow-up with me again in 4 to 6 weeks for reevaluation.  If symptoms persist, we may need to consider merits of further diagnostic imaging as the patient is significantly frustrated by the chronicity of his right shoulder pain.  I have encouraged him to call me with questions or concerns prior to that visit.  Consent obtained and verified. Time-out conducted. Noted no overlying erythema, induration, or other signs of local infection. Skin prepped in a sterile fashion. Topical analgesic spray: Ethyl chloride. Joint: right shoulder (subacromial) Needle: 25g 1.5 inch Completed without difficulty. Meds: 3cc 1% xylocaine, 1cc (40m) depomedrol  Advised to call if fevers/chills, erythema, induration, drainage, or persistent bleeding.

## 2018-05-23 NOTE — Telephone Encounter (Signed)
Referral placed to Dr. Tamera Punt. Appt: 06/07/18 @ 2:30 pm for right shoulder rotator cuff tendinopathy. Pt is aware.

## 2018-05-30 ENCOUNTER — Telehealth: Payer: Self-pay

## 2018-05-30 ENCOUNTER — Ambulatory Visit (INDEPENDENT_AMBULATORY_CARE_PROVIDER_SITE_OTHER): Payer: No Typology Code available for payment source | Admitting: Pharmacist

## 2018-05-30 DIAGNOSIS — Z79899 Other long term (current) drug therapy: Secondary | ICD-10-CM

## 2018-05-30 MED ORDER — ADALIMUMAB 80 MG/0.8ML ~~LOC~~ AJKT: 2.0000 "pen " | AUTO-INJECTOR | SUBCUTANEOUS | 0 refills | Status: DC

## 2018-05-30 NOTE — Telephone Encounter (Signed)
Ok to start. Thanks

## 2018-05-30 NOTE — Telephone Encounter (Signed)
Pt is due to start Humira injections this Thursday. He had a steroid injection for his shoulder last week and the Humira Ambassador suggested he call the office to make sure it was ok to start the Humira since he had the cortisone injection. Please advise.

## 2018-05-30 NOTE — Telephone Encounter (Signed)
Spoke with pt and he is aware.

## 2018-05-30 NOTE — Progress Notes (Signed)
   S: Patient presents to [facility] for review of their specialty medication therapy.  Patient is currently taking Humira for colitis. Patient is managed by Dr. Henrene Pastor for this.   Adherence: has not started yet  Efficacy: has not started yet  Dosing:  160 mg (given as four 40 mg injections on day 1 or given as two 40 mg injections per day over 2 consecutive days), then 80 mg 2 weeks later (day 15). Maintenance: 40 mg every other week beginning day 29. Note: Some patients may require 40 mg every week as maintenance therapy Karl Pock [id number]).  Screening: TB test: negative Hepatitis: LFTs WNL  Monitoring: S/sx of infection: denies CBC: see below S/sx of hypersensitivity: S/sx of malignancy: denies S/sx of heart failure: denies  Patient concerned about cost when he starts on Medicare as his wife wants to retire soon.  O:     Lab Results  Component Value Date   WBC 4.7 04/27/2018   HGB 14.6 04/27/2018   HCT 41.7 04/27/2018   MCV 90.4 04/27/2018   PLT 246.0 04/27/2018      Chemistry      Component Value Date/Time   NA 138 04/27/2018 1040   K 4.2 04/27/2018 1040   CL 104 04/27/2018 1040   CO2 27 04/27/2018 1040   BUN 16 04/27/2018 1040   CREATININE 0.78 04/27/2018 1040      Component Value Date/Time   CALCIUM 9.7 04/27/2018 1040   ALKPHOS 42 04/27/2018 1040   AST 18 04/27/2018 1040   ALT 16 04/27/2018 1040   BILITOT 1.3 (H) 04/27/2018 1040       A/P: 1. Medication review: Patient currently on Humira for colitis. Dr. Henrene Pastor reviewed the use of this medication in the patient's condition extensively - see his note. Reviewed the medication with the patient, including the following: Humira is a TNF blocking agent indicated for ankylosing spondylitis, Crohn's disease, Hidradenitis suppurativa, psoriatic arthritis, plaque psoriasis, ulcerative colitis, and uveitis. Patient educated on purpose, proper use and potential adverse effects of Humira. The most common  adverse effects are infections, headache, and injection site reactions. There is the possibility of an increased risk of malignancy but it is not well understood if this increased risk is due to there medication or the disease state. There are rare cases of pancytopenia and aplastic anemia. Encouraged patient to discuss specialty copays with Part D plans when he starts looking. No recommendations for any changes at this time.   Christella Hartigan, PharmD, BCPS, BCACP, CPP Clinical Pharmacist Practitioner  [phone]

## 2018-05-31 ENCOUNTER — Other Ambulatory Visit: Payer: Self-pay | Admitting: Pharmacist

## 2018-05-31 MED ORDER — ADALIMUMAB 80 MG/0.8ML ~~LOC~~ AJKT: 2.0000 "pen " | AUTO-INJECTOR | SUBCUTANEOUS | 0 refills | Status: DC

## 2018-05-31 MED ORDER — ADALIMUMAB 40 MG/0.4ML ~~LOC~~ AJKT: 40.0000 mg | AUTO-INJECTOR | SUBCUTANEOUS | 11 refills | Status: DC

## 2018-05-31 MED FILL — HUMIRA PEN-CD/UC/HS STARTER: 80 | 28 days supply | Qty: 3 | Fill #0

## 2018-05-31 NOTE — Addendum Note (Signed)
Addended by: Rica Mast on: 05/31/2018 10:58 AM   Modules accepted: Orders

## 2018-06-08 ENCOUNTER — Other Ambulatory Visit (HOSPITAL_COMMUNITY): Payer: Self-pay | Admitting: Orthopedic Surgery

## 2018-06-08 ENCOUNTER — Other Ambulatory Visit: Payer: Self-pay | Admitting: Orthopedic Surgery

## 2018-06-08 DIAGNOSIS — M25511 Pain in right shoulder: Secondary | ICD-10-CM

## 2018-06-13 ENCOUNTER — Telehealth: Payer: Self-pay | Admitting: Internal Medicine

## 2018-06-13 NOTE — Telephone Encounter (Signed)
Attempted to call pt back, received message that mailbox is full and cannot accept messages at this time.

## 2018-06-13 NOTE — Telephone Encounter (Signed)
Pt started Humira a week ago and reported having an "anxious itch inside of him that feels very uncomfortable."  Pt requested a CB to discuss.

## 2018-06-13 NOTE — Telephone Encounter (Signed)
Pt states that he took his first dose of Humira on 06/06/18, 186m. He is decreasing his budesonide and is taking a pill every other day. Pt reports he is very irritable and anxious, reports it feels like he is "itching inside." Pt wants to know what he can do. Please advise.

## 2018-06-14 MED FILL — DOXEPIN HCL 25 MG CAPS: 25 | 30 days supply | Qty: 60 | Fill #0

## 2018-06-14 NOTE — Telephone Encounter (Signed)
Honestly, not clear to me that symptoms of irritability and subjective "itching on the inside" is necessarily medication related.  He could see his PCP regarding therapies for generalized anxiety.

## 2018-06-14 NOTE — Telephone Encounter (Signed)
Spoke with pt and he is aware.

## 2018-06-16 ENCOUNTER — Ambulatory Visit (HOSPITAL_COMMUNITY): Admission: RE | Admit: 2018-06-16 | Payer: No Typology Code available for payment source | Source: Ambulatory Visit

## 2018-06-16 ENCOUNTER — Encounter (HOSPITAL_COMMUNITY): Payer: Self-pay

## 2018-06-20 ENCOUNTER — Ambulatory Visit: Payer: No Typology Code available for payment source | Admitting: Sports Medicine

## 2018-06-21 MED FILL — HUMIRA PEN 40 MG/0.4ML PNKT: 40 | 28 days supply | Qty: 2 | Fill #0

## 2018-06-27 ENCOUNTER — Ambulatory Visit: Payer: No Typology Code available for payment source | Admitting: Internal Medicine

## 2018-07-05 ENCOUNTER — Telehealth (HOSPITAL_COMMUNITY): Payer: Self-pay | Admitting: Pharmacy Technician

## 2018-07-05 MED FILL — BUDESONIDE 3 MG CPEP: 3 | 8 days supply | Qty: 25 | Fill #0

## 2018-07-05 NOTE — Telephone Encounter (Signed)
Opened in error

## 2018-07-11 MED FILL — CYANOCOBALAMIN 1,000 MCG/ML: 1000 | 84 days supply | Qty: 3 | Fill #1

## 2018-07-25 MED FILL — HUMIRA PEN 40 MG/0.4ML PNKT: 40 | 28 days supply | Qty: 2 | Fill #1

## 2018-08-15 MED FILL — HUMIRA PEN 40 MG/0.4ML PNKT: 40 | 28 days supply | Qty: 2 | Fill #2

## 2018-09-06 ENCOUNTER — Telehealth: Payer: Self-pay

## 2018-09-07 NOTE — Telephone Encounter (Signed)
Prescreen complete

## 2018-09-08 ENCOUNTER — Telehealth: Payer: Self-pay

## 2018-09-08 ENCOUNTER — Ambulatory Visit (INDEPENDENT_AMBULATORY_CARE_PROVIDER_SITE_OTHER): Payer: No Typology Code available for payment source | Admitting: Internal Medicine

## 2018-09-08 ENCOUNTER — Encounter: Payer: Self-pay | Admitting: Internal Medicine

## 2018-09-08 ENCOUNTER — Other Ambulatory Visit: Payer: Self-pay

## 2018-09-08 VITALS — Ht 72.0 in | Wt 149.0 lb

## 2018-09-08 DIAGNOSIS — K52832 Lymphocytic colitis: Secondary | ICD-10-CM | POA: Diagnosis not present

## 2018-09-08 DIAGNOSIS — R159 Full incontinence of feces: Secondary | ICD-10-CM | POA: Diagnosis not present

## 2018-09-08 DIAGNOSIS — R197 Diarrhea, unspecified: Secondary | ICD-10-CM | POA: Diagnosis not present

## 2018-09-08 MED ORDER — BUDESONIDE 3 MG PO CPEP: 6.0000 mg | ORAL_CAPSULE | ORAL | 0 refills | Status: DC

## 2018-09-08 MED ORDER — DIPHENOXYLATE-ATROPINE 2.5-0.025 MG PO TABS: ORAL_TABLET | ORAL | 1 refills | Status: DC

## 2018-09-08 MED FILL — DIPHENOXYLATE-ATROPINE 2.5-: 2.5-0.025 | 90 days supply | Qty: 360 | Fill #0

## 2018-09-08 MED FILL — BUDESONIDE 3 MG CPEP: 3 | 45 days supply | Qty: 90 | Fill #0

## 2018-09-08 NOTE — Addendum Note (Signed)
Addended by: Julieanne Cotton K on: 09/08/2018 11:21 AM   Modules accepted: Orders

## 2018-09-08 NOTE — Telephone Encounter (Signed)
-----   Message from Irene Shipper, MD sent at 09/08/2018 11:01 AM EDT ----- Refill meds and arrange blood work as outlined

## 2018-09-08 NOTE — Progress Notes (Signed)
HISTORY OF PRESENT ILLNESS:  Frank Larson is a 68 y.o. male with refractory lymphocytic colitis who reestablished care in this office April 27, 2018.  See that detailed dictation.  He was subsequently started on Humira therapy which he began in late February or early March.  He tapered off of budesonide.  His colitis was for the most part refractory to budesonide.  He tells me that initially he felt like Humira therapy was helpful but in recent months he has been having significant difficulties with diarrhea.  He describes 2 or 3 loose bowel movements in the morning.  Incontinence when walking the dog.  Multiple loose bowel movements throughout the remainder the day and during the night.  He is tolerating therapy well without complaints.  He has been off budesonide.  He has been using Lomotil 1 twice daily over the past month.  He is discouraged regarding his condition.  No new complaints  REVIEW OF SYSTEMS:  All non-GI ROS negative unless otherwise stated in the HPI except for arthritis  Past Medical History:  Diagnosis Date  . Allergy   . Arthritis   . Colitis   . Diverticulosis   . Hyperlipidemia    off medicines currently   . Metatarsal fracture    right    Past Surgical History:  Procedure Laterality Date  . COLONOSCOPY  08/07/2002  . INGUINAL HERNIA REPAIR    . NASAL SINUS SURGERY    . NOSE SURGERY     broken nose    Social History Frank Larson  reports that he has never smoked. He has never used smokeless tobacco. He reports current alcohol use of about 4.0 standard drinks of alcohol per week. He reports that he does not use drugs.  family history includes Colitis in his father; Heart disease in his father; Hyperlipidemia in his father; Parkinson's disease in his mother; Peptic Ulcer Disease in his father and mother.  No Known Allergies     PHYSICAL EXAMINATION: No physical examination with telehealth visit   ASSESSMENT:  1.  Refractory lymphocytic colitis with  chronic diarrhea, incontinence.  Now on Humira therapy for over 3 months.  Significant ongoing symptoms as described.  Due for his next dose today   PLAN:  1.  REFILL BUDESONIDE prescription.  I have asked him to reinitiate therapy and take 6 mg (2 tablets) each morning until further notice. 2.  REFILL LOMOTIL.  I recommended that he increase his dosage and take one 4 times daily 3.  HUMIRA DRUG and ANTIBODY levels to be ordered.  I want him to have this laboratory test performed in 2 weeks (June 26) prior to his next dose of Humira.  I discussed with him the possible results and potential recommendations.  Specifically, abandoning therapy versus adjustment of therapy. 4.  Contact the office in the interim for questions or problems This telehealth medicine visit was initiated by and consented for by the patient who was in his home while I was in my office throughout.  He understands it may be associated charge for this service which totaled approximately 25 minutes.

## 2018-09-08 NOTE — Patient Instructions (Addendum)
1.  REFILL BUDESONIDE prescription.  I have asked him to reinitiate therapy and take 6 mg (2 tablets) each morning until further notice . 2.  REFILL LOMOTIL.  I recommended that he increase his dosage and take one 4 times daily  3.  HUMIRA DRUG and ANTIBODY levels to be ordered.  I want him to have this laboratory test performed in 2 weeks (June 26) prior to his next dose of Humira.  I discussed with him the possible results and potential recommendations.  Specifically, abandoning therapy versus adjustment of therapy.  4.  Contact the office in the interim for questions or problems

## 2018-09-08 NOTE — Telephone Encounter (Signed)
Spoke with patient and let him I had refilled his prescriptions and put in the order for his labwork.  He was instructed to Friday morning, 09/22/2018 (two weeks from today) to have this done.  Patient agreed.

## 2018-09-22 ENCOUNTER — Other Ambulatory Visit: Payer: No Typology Code available for payment source

## 2018-09-22 DIAGNOSIS — R159 Full incontinence of feces: Secondary | ICD-10-CM

## 2018-09-22 DIAGNOSIS — R197 Diarrhea, unspecified: Secondary | ICD-10-CM

## 2018-09-22 DIAGNOSIS — K52832 Lymphocytic colitis: Secondary | ICD-10-CM

## 2018-09-28 MED FILL — FLUTICASONE PROP 50 MCG SPR: 50 | 30 days supply | Qty: 16 | Fill #1

## 2018-09-28 MED FILL — HUMIRA PEN 40 MG/0.4ML PNKT: 40 | 28 days supply | Qty: 2 | Fill #3

## 2018-09-29 LAB — SERIAL MONITORING

## 2018-09-30 LAB — ADALIMUMAB+AB (SERIAL MONITOR)
Adalimumab Drug Level: 3 ug/mL
Anti-Adalimumab Antibody: 299 ng/mL

## 2018-10-02 ENCOUNTER — Other Ambulatory Visit: Payer: Self-pay | Admitting: Pharmacist

## 2018-10-02 ENCOUNTER — Other Ambulatory Visit: Payer: Self-pay

## 2018-10-02 MED ORDER — HUMIRA (2 PEN) 40 MG/0.4ML ~~LOC~~ AJKT: 40.0000 mg | AUTO-INJECTOR | SUBCUTANEOUS | 11 refills | Status: DC

## 2018-10-10 ENCOUNTER — Telehealth: Payer: Self-pay | Admitting: Internal Medicine

## 2018-10-10 NOTE — Telephone Encounter (Signed)
Patient called wanting to inform the nures that someone has to call [phone] [facility] since it was a change in medication frequency so that the change can be approved. Patient is wanting a call back once the call has been made.

## 2018-10-13 NOTE — Telephone Encounter (Signed)
Not sure what to do about this.  I called the number and they talked about the fact that the dose had changed (starter kit versus maintenance?) and that the patient tried to fill it too soon - can't fill till 10/19/2018.  They also said the existing prior authorization expires 10/23/2018.  The more she told me the more confused I got.  Please help!!!

## 2018-10-16 NOTE — Telephone Encounter (Signed)
Spoke with [facility] and they state pharmacy must call them with dose change. Spoke with pharmacy, new script was sent in 10/02/18. Pharmacy will call [facility] with new directions.

## 2018-10-18 ENCOUNTER — Telehealth: Payer: Self-pay

## 2018-10-18 NOTE — Telephone Encounter (Signed)
Spoke with patient who received a phone call from the pharmacy telling him that [facility] needed Dr. Henrene Pastor to call them with Humira dose change which is not what they told Vaughan Basta in phone note dated 7/14.  Vaughan Basta is going to call [employer] to find out that is going on.

## 2018-10-18 NOTE — Telephone Encounter (Signed)
Erroneous encounter

## 2018-10-18 NOTE — Telephone Encounter (Signed)
Phone screening complete 

## 2018-10-19 MED FILL — HUMIRA PEN 40 MG/0.4ML PNKT: 40 | 28 days supply | Qty: 4 | Fill #0

## 2018-10-23 MED FILL — CYANOCOBALAMIN 1,000 MCG/ML: 1000 | 84 days supply | Qty: 3 | Fill #2

## 2018-11-13 MED FILL — HUMIRA PEN 40 MG/0.4ML PNKT: 40 | 28 days supply | Qty: 4 | Fill #1

## 2018-12-06 MED FILL — HUMIRA PEN 40 MG/0.4ML PNKT: 40 | 28 days supply | Qty: 4 | Fill #2

## 2018-12-06 MED FILL — HYDROCORTISONE 1% CREAM: 1 | 15 days supply | Qty: 28 | Fill #0

## 2018-12-06 MED FILL — CLOTRIMAZOLE 1 % CREA: 1 | 10 days supply | Qty: 45 | Fill #0

## 2018-12-07 ENCOUNTER — Other Ambulatory Visit: Payer: Self-pay

## 2018-12-07 ENCOUNTER — Ambulatory Visit: Payer: No Typology Code available for payment source | Admitting: Sports Medicine

## 2018-12-07 ENCOUNTER — Ambulatory Visit: Payer: Self-pay

## 2018-12-07 VITALS — BP 112/74 | Ht 73.0 in | Wt 148.0 lb

## 2018-12-07 DIAGNOSIS — M25511 Pain in right shoulder: Secondary | ICD-10-CM

## 2018-12-07 DIAGNOSIS — G8929 Other chronic pain: Secondary | ICD-10-CM | POA: Diagnosis not present

## 2018-12-07 DIAGNOSIS — M67911 Unspecified disorder of synovium and tendon, right shoulder: Secondary | ICD-10-CM

## 2018-12-07 NOTE — Assessment & Plan Note (Signed)
The amount of calcification has increased I think this triggers the pain I do not see a tear  Plan an elective barbotage

## 2018-12-07 NOTE — Progress Notes (Signed)
Subjective:    Patient ID: Frank Larson, male    DOB: 09/11/1950, 68 y.o.   MRN: [id number]  CC: Right shoulder pain  HPI  Patient is a 68 year old male who presents for follow-up of right shoulder pain.  Patient had been seen in the clinic multiple times in the past for both right and left shoulder pain.  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  Patient was seen back in February 2020 and had a subacromial cortisone injection at that time that provided mild relief. He was also referred to orthopedic surgery in March 2020 for evaluation. Patient states he had imaging, and was told he had partial rotator cuff tearing, but nothing that warranted surgical intervention. He was given home physical therapy exercises with stretching and strengthening and told to follow-up as needed. Since this time, his right shoulder has been worsening in terms of pain and mobility.  Today, patient states he has been doing a lot more household chores since this time, which he feels has been aggravating his right shoulder.  Denies any trauma or inciting event. Patient states his pain is at the lateral shoulder pointing just distal to his acromion.  Patient states he does have some pain during activity, but his pain is worse following repetitive movement motions like scrubbing floors or cleaning windows.  He also endorses pain at nighttime when he tries to sleep on his right side, denies any numbness tingling, or grip weakness. Patient does not take any medications for pain and has tried icing in the past, however has not done this recently.    Review of Systems No fever/chills. See HPI above, otherwise negative.    Objective:   Physical Exam  General: Well-appearing, nontoxic, in NAD Pulm: Breathing unlabored, in no respiratory distress Psych: Appropriate mood and affect Neuro: Alert, oriented MSK:  Right Shoulder/RUE: -Inspection: No gross deformity noted; no  swelling/effusion     + mild atrophy of supraspinatus of Left post shoulder compared to right -Palpation: No warmth over joint; + TTP over lateral shoulder near supraspinatus insertion -ROM: Full active and passive range of motion in all planes -Strength/stability: 5/5 strength with flexion/extension, internal/external rotation; no glenohumeral or scapular instability -Sensory/vascular: Sensation to light touch intact; NVI --Provocative testing:      Positive: Hawkins-Kennedy, Hornblower's, pain with liftoff testing positioning      Negative: Empty can, drop arm, sulcus sign, speeds/Yergason's, Neer's, O'Brien's  Neck -no gross deformity, no erythema or bruising.  No cervical spinal tenderness to palpation, or step-off.  Full range of motion with neck flexion/extension/rotation.  Negative Spurling test.   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 No evidence of tendon tears.  Ultrasound and interpretation by [name]. Avry Roedl, MD      Assessment & Plan:  1. 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  Physical examination somewhat inconsistent, however with appropriate strength and no tearing of rotator cuff tendons on ultrasound.  Pain likely secondary to irritation of rotator cuff muscles from significant calcification with certain  arm movements.  - will proceed with elective  barbotage of SST under US guidance, patient to return in 1-2 weeks for this procedure, he has had this procedure performed in the past on his left shoulder with relief - Activity modification and exercise as tolerated, letting pain be your guide - May consider ice, Tylenol as needed for acute pain, however would likely not help chronic problem - We will follow-up back in office after barbotaged to reassess   Renne Musca, DO  PGY-2  I observed and examined the patient with the resident and agree with assessment and plan.  Note reviewed and modified by me. Ila Mcgill, MD

## 2018-12-13 MED FILL — DIPHENOXYLATE-ATROPINE 2.5-: 2.5-0.025 | 90 days supply | Qty: 360 | Fill #1

## 2018-12-21 ENCOUNTER — Ambulatory Visit: Payer: No Typology Code available for payment source | Admitting: Sports Medicine

## 2018-12-26 ENCOUNTER — Encounter: Payer: Self-pay | Admitting: Sports Medicine

## 2018-12-26 ENCOUNTER — Ambulatory Visit (INDEPENDENT_AMBULATORY_CARE_PROVIDER_SITE_OTHER): Payer: No Typology Code available for payment source | Admitting: Sports Medicine

## 2018-12-26 ENCOUNTER — Ambulatory Visit: Payer: Self-pay

## 2018-12-26 ENCOUNTER — Other Ambulatory Visit: Payer: Self-pay

## 2018-12-26 VITALS — BP 130/82 | Ht 73.0 in | Wt 148.0 lb

## 2018-12-26 DIAGNOSIS — M7531 Calcific tendinitis of right shoulder: Secondary | ICD-10-CM | POA: Diagnosis not present

## 2018-12-26 DIAGNOSIS — G8929 Other chronic pain: Secondary | ICD-10-CM | POA: Diagnosis not present

## 2018-12-26 DIAGNOSIS — M75111 Incomplete rotator cuff tear or rupture of right shoulder, not specified as traumatic: Secondary | ICD-10-CM | POA: Diagnosis not present

## 2018-12-26 DIAGNOSIS — M25511 Pain in right shoulder: Secondary | ICD-10-CM

## 2018-12-26 MED ORDER — METHYLPREDNISOLONE ACETATE 40 MG/ML IJ SUSP
40.0000 mg | Freq: Once | INTRAMUSCULAR | Status: AC
  Administered 2018-12-26: 11:00:00 40 mg via INTRA_ARTICULAR

## 2018-12-26 NOTE — Progress Notes (Signed)
PCP: Josetta Huddle, MD  Subjective:   HPI: Patient is a 68 y.o. male here for barbotage of right supraspinatus tendon calcium deposit.  Patient was last seen 1 to 2 weeks ago.  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  He has had a barbotage done in the past which he notes has improved his pain considerably.  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  Pain is located lateral aspect of her shoulder and does not radiate.  He denies any numbness or tingling.  Patient denies any previous injury or trauma or bruising or swelling.  Has failed activity modification as well as physical therapy exercises in the past.  Tylenol only mildly helps improve his pain.    Review of Systems: See HPI above.  Past Medical History:  Diagnosis Date  . Allergy   . Arthritis   . Colitis   . Diverticulosis   . Hyperlipidemia    off medicines currently   . Metatarsal fracture    right    Current Outpatient Medications on File Prior to Visit  Medication Sig Dispense Refill  . Adalimumab (HUMIRA PEN) 40 MG/0.4ML PNKT Inject 40 mg into the skin once a week. 4 each 11  . cyanocobalamin (,VITAMIN B-12,) 1000 MCG/ML injection INJECT 1ML ONCE MONTHLY 10 mL 0  . diphenoxylate-atropine (LOMOTIL) 2.5-0.025 MG tablet TAKE 1 TABLET BY MOUTH FOUR TIMES DAILY AS NEEDED FOR DIARRHEA OR LOOSE STOOLS. 360 tablet 1  . budesonide (ENTOCORT EC) 3 MG 24 hr capsule Take 2 capsules (6 mg total) by mouth every morning. (Patient not taking: Reported on 12/07/2018) 90 capsule 0  . Cholecalciferol (VITAMIN D3) 50 MCG (2000 UT) capsule Take by mouth.    . fluticasone (FLONASE) 50 MCG/ACT nasal spray Place 2 sprays into both nostrils daily. 48 g 3   No current facility-administered medications on file prior to visit.     Past Surgical History:  Procedure Laterality Date  . COLONOSCOPY  08/07/2002  . INGUINAL HERNIA REPAIR    . NASAL SINUS SURGERY     . NOSE SURGERY     broken nose    No Known Allergies  Social History   Socioeconomic History  . Marital status: Married    Spouse name: Not on file  . Number of children: 1  . Years of education: Not on file  . Highest education level: Not on file  Occupational History  . Occupation: psychologist  Social Needs  . Financial resource strain: Not on file  . Food insecurity    Worry: Not on file    Inability: Not on file  . Transportation needs    Medical: Not on file    Non-medical: Not on file  Tobacco Use  . Smoking status: Never Smoker  . Smokeless tobacco: Never Used  Substance and Sexual Activity  . Alcohol use: Yes    Alcohol/week: 4.0 standard drinks    Types: 4 Glasses of wine per week    Comment: social  . Drug use: No  . Sexual activity: Yes  Lifestyle  . Physical activity    Days per week: Not on file    Minutes per session: Not on file  . Stress: Not on file  Relationships  . Social Herbalist on phone: Not on file    Gets together: Not on file    Attends religious service: Not on file    Active  member of club or organization: Not on file    Attends meetings of clubs or organizations: Not on file    Relationship status: Not on file  . Intimate partner violence    Fear of current or ex partner: Not on file    Emotionally abused: Not on file    Physically abused: Not on file    Forced sexual activity: Not on file  Other Topics Concern  . Not on file  Social History Narrative   Daily caffeine----regular exercise             Family History  Problem Relation Age of Onset  . Hyperlipidemia Father   . Heart disease Father   . Peptic Ulcer Disease Father   . Colitis Father   . Parkinson's disease Mother   . Peptic Ulcer Disease Mother   . Colon cancer Neg Hx   . Diabetes Neg Hx   . Colon polyps Neg Hx   . Esophageal cancer Neg Hx   . Rectal cancer Neg Hx   . Stomach cancer Neg Hx         Objective:  Physical Exam: BP  130/82   Ht 6' 1"  (1.854 m)   Wt 148 lb (67.1 kg)   BMI 19.53 kg/m  Gen: NAD, comfortable in exam room Lungs: Breathing comfortably on room air Shoulder Exam right -Inspection: No discoloration, no deformity -Palpation: Tenderness over supraspinatus insertion-ROM (active): Abduction: 180 degrees; Forward Flexion: 180 degrees; Internal Rotation: T10 -Strength: Abduction: 5/5; Forward Flexion: 5/5; Internal Rotation: 5/5; External Rotation: 5/5 -Special Tests: Hawkins: Positive; Neers: Positive; Jobs: Positive; O'briens: Negative; Speeds: Negative; Yergasons: Negative; Cross arm: negative -Limb neurovascularly intact, no instability noted  Limited diagnostic ultrasound of the right shoulder Findings: - Significant calcification near the proximal insertion of the supraspinatus.  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  Small partial-thickness tear of the supraspinatus tendon noted.  This is new compared to previous exams  Ultrasound and interpretation done with the assistance of Dr. Oneida Alar    Assessment & Plan:  Patient is a 68 y.o. male here for follow-up of right supraspinatus calcific tendinopathy  1.  Right supraspinatus calcific tendinopathy with partial tear of the supraspinatus tendon - Barbotage performed in clinic today.  Consent was obtained prior to the procedure.  Skin was cleaned and prepped using alcohol swab.  3 cc of lidocaine were injected into the soft tissue overlying the supraspinatus for anesthesia prior to procedure.  4 cc of normal saline, 2 cc of lidocaine, and 40 mg of Depo-Medrol were injected into the calcifications on the distal supraspinatus.  Patient tolerated the procedure well.  There were no complications. - Patient to  continue range of motion exercises -Patient may start swimming and low volumes as tolerated  Patient follow-up as needed  I examined and supervised the [place] and the procedure with Dr. Sheppard Coil  and agree with assessment and plan.  Note reviewed and modified by me.  Ila Mcgill, MD

## 2018-12-28 DEATH — deceased

## 2019-01-12 MED FILL — HUMIRA PEN 40 MG/0.4ML PNKT: 40 | 28 days supply | Qty: 4 | Fill #3

## 2019-01-16 ENCOUNTER — Other Ambulatory Visit: Payer: Self-pay | Admitting: Registered"

## 2019-01-16 DIAGNOSIS — Z20822 Contact with and (suspected) exposure to covid-19: Secondary | ICD-10-CM

## 2019-01-17 MED FILL — CYANOCOBALAMIN 1,000 MCG/ML: 1000 | 84 days supply | Qty: 3 | Fill #3

## 2019-01-18 LAB — NOVEL CORONAVIRUS, NAA: SARS-CoV-2, NAA: NOT DETECTED

## 2019-02-07 MED FILL — HUMIRA PEN 40 MG/0.4ML PNKT: 40 | 28 days supply | Qty: 4 | Fill #4

## 2019-02-26 MED FILL — FLUTICASONE PROP 50 MCG SPR: 50 | 30 days supply | Qty: 16 | Fill #2

## 2019-02-28 MED FILL — HUMIRA PEN 40 MG/0.4ML PNKT: 40 | 28 days supply | Qty: 4 | Fill #5

## 2019-03-19 ENCOUNTER — Other Ambulatory Visit: Payer: Self-pay | Admitting: Internal Medicine

## 2019-03-20 MED FILL — DIPHENOXYLATE-ATROPINE 2.5-: 2.5-0.025 | 90 days supply | Qty: 360 | Fill #0

## 2019-03-22 MED FILL — FLUTICASONE PROP 50 MCG SPR: 50 | 30 days supply | Qty: 16 | Fill #0

## 2019-04-04 MED FILL — HUMIRA PEN 40 MG/0.4ML PNKT: 40 | 28 days supply | Qty: 4 | Fill #6

## 2019-04-30 ENCOUNTER — Encounter: Payer: Self-pay | Admitting: Internal Medicine

## 2019-04-30 ENCOUNTER — Telehealth: Payer: Self-pay | Admitting: Internal Medicine

## 2019-04-30 ENCOUNTER — Ambulatory Visit (INDEPENDENT_AMBULATORY_CARE_PROVIDER_SITE_OTHER): Payer: No Typology Code available for payment source | Admitting: Internal Medicine

## 2019-04-30 ENCOUNTER — Telehealth: Payer: Self-pay

## 2019-04-30 VITALS — Ht 72.0 in | Wt 148.0 lb

## 2019-04-30 DIAGNOSIS — K219 Gastro-esophageal reflux disease without esophagitis: Secondary | ICD-10-CM | POA: Diagnosis not present

## 2019-04-30 DIAGNOSIS — Z5181 Encounter for therapeutic drug level monitoring: Secondary | ICD-10-CM

## 2019-04-30 DIAGNOSIS — K52832 Lymphocytic colitis: Secondary | ICD-10-CM

## 2019-04-30 DIAGNOSIS — R159 Full incontinence of feces: Secondary | ICD-10-CM | POA: Diagnosis not present

## 2019-04-30 DIAGNOSIS — R197 Diarrhea, unspecified: Secondary | ICD-10-CM

## 2019-04-30 DIAGNOSIS — Z79899 Other long term (current) drug therapy: Secondary | ICD-10-CM

## 2019-04-30 NOTE — Progress Notes (Signed)
HISTORY OF PRESENT ILLNESS:  Frank Larson is a 69 y.o. male with refractory lymphocytic colitis who reestablish care with this office January 2020.  He was subsequently started on Humira therapy which began in late February or early March.  He tapered off budesonide.  His colitis was for the most part refractory to budesonide.  He was last seen via telehealth medicine September 08, 2018 at which point he reported that Humira seemed to be helpful initially but not as much at the time of his last telehealth visit.  See that dictation for details.  We obtained Humira drug and antibody levels.  Drug levels were low.  Antibody levels were moderate.  Humira was increased to 40 mg once weekly.  He follows up at this time having requested a virtual visit.  The patient tells me that he was doing well until about 4 weeks ago when he noticed worsening of his bowel habits.  Previously he reports to me having had 1 possibly 2 formed bowel movements per day.  Currently he describes 3 bowel movements in the morning and a bowel movement occasionally at night.  Occasional incontinence.  Not as bad as it had been previously but worse.  Currently taking Lomotil 1 twice daily.  No budesonide for some time.  No new complaints.  He feels this is consistent with his lymphocytic colitis.  He does report some stiff joints or joint aches which improved with time and activity throughout the day.  REVIEW OF SYSTEMS:  All non-GI ROS negative unless otherwise stated in the HPI except for decreased sexual drive  Past Medical History:  Diagnosis Date  . Allergy   . Arthritis   . Colitis   . Diverticulosis   . Hyperlipidemia    off medicines currently   . Metatarsal fracture    right    Past Surgical History:  Procedure Laterality Date  . COLONOSCOPY  08/07/2002  . INGUINAL HERNIA REPAIR    . NASAL SINUS SURGERY    . NOSE SURGERY     broken nose    Social History [name]  reports that he has never smoked. He has never  used smokeless tobacco. He reports current alcohol use of about 4.0 standard drinks of alcohol per week. He reports that he does not use drugs.  family history includes Colitis in his father; Heart disease in his father; Hyperlipidemia in his father; Parkinson's disease in his mother; Peptic Ulcer Disease in his father and mother.  No Known Allergies     PHYSICAL EXAMINATION: Vital signs: Ht 6' (1.829 m)   Wt 148 lb (67.1 kg)   BMI 20.07 kg/m   No physical examination with telehealth medicine visit  ASSESSMENT:  1.  Lymphocytic colitis.  Had been doing well on Humira 40 mg once weekly.  Now with recurrent symptoms.  Last colonoscopy March 31, 2018 2.  GERD.  Previous EGD with mild esophagitis (2013)  PLAN:  1.  Obtain Humira drug and antibody levels.  This should be done prior to his regular scheduled treatment (he takes on Sundays).  He will come for blood work on Friday, May 11, 2019 2.  Obtain CBC, C-reactive protein, comprehensive metabolic panel.  This blood work can be done, as well, on Friday, May 11, 2019 3.  Continue Humira 40 mg once weekly 4.  Increase Lomotil to twice daily 5.  Additional recommendations and follow-up to be determined after the above results available for review Total time of 30 minutes  was spent preparing to see the patient, reviewing relevant test, obtaining history, physical exam with telehealth medicine, counseling the patient regarding his condition, answering questions, directing medications and ordering laboratory testing including drug level testing, and documenting clinical information in the health record

## 2019-04-30 NOTE — Telephone Encounter (Signed)
Pt has an appt with Dr. Henrene Pastor today at 10:00am. He wants to know if it can be virtual.

## 2019-04-30 NOTE — Telephone Encounter (Signed)
televisit confirmed for 10:00am

## 2019-04-30 NOTE — Patient Instructions (Addendum)
Your provider has requested that you go to the basement level for lab work on 05/11/2019. Press "B" on the elevator. The lab is located at the first door on the left as you exit the elevator.  Continue Humira once weekly.  Increase Lomotil to two times a day   Additional recommendations and follow-up to be determined after the above results available for review

## 2019-04-30 NOTE — Addendum Note (Signed)
Addended by: Audrea Muscat on: 04/30/2019 02:21 PM   Modules accepted: Orders

## 2019-04-30 NOTE — Telephone Encounter (Signed)
Pre-screening completed for televisit

## 2019-05-04 MED FILL — HUMIRA PEN 40 MG/0.4ML PNKT: 40 | 28 days supply | Qty: 4 | Fill #7

## 2019-05-07 MED FILL — CYANOCOBALAMIN 1,000 MCG/ML: 1000 | 84 days supply | Qty: 3 | Fill #0

## 2019-05-08 ENCOUNTER — Other Ambulatory Visit (HOSPITAL_COMMUNITY): Payer: Self-pay | Admitting: Internal Medicine

## 2019-05-09 ENCOUNTER — Telehealth: Payer: Self-pay | Admitting: Internal Medicine

## 2019-05-09 NOTE — Telephone Encounter (Signed)
Pt had appt with PCP yesterday and states that his BP was 180/80. He thinks that it is humira that has caused the increase. He wanted to let Dr. Henrene Pastor know.

## 2019-05-09 NOTE — Telephone Encounter (Signed)
I am not sure if that is the case.  His PCP should closely address his blood pressure as he would otherwise.  I appreciate the follow-up

## 2019-05-09 NOTE — Telephone Encounter (Signed)
Spoke with pt and he is aware.

## 2019-05-09 NOTE — Telephone Encounter (Signed)
Pt wanted to let Dr. Henrene Pastor know that he saw his PCP yesterday and his BP was elevated at 180/80. Pt thinks the Humira has caused the increase in his BP and he wanted to let Dr. Henrene Pastor know.

## 2019-05-11 ENCOUNTER — Other Ambulatory Visit (INDEPENDENT_AMBULATORY_CARE_PROVIDER_SITE_OTHER): Payer: No Typology Code available for payment source

## 2019-05-11 DIAGNOSIS — K52832 Lymphocytic colitis: Secondary | ICD-10-CM

## 2019-05-11 DIAGNOSIS — R197 Diarrhea, unspecified: Secondary | ICD-10-CM

## 2019-05-11 DIAGNOSIS — R159 Full incontinence of feces: Secondary | ICD-10-CM

## 2019-05-11 LAB — COMPREHENSIVE METABOLIC PANEL
ALT: 14 U/L (ref 0–53)
AST: 18 U/L (ref 0–37)
Albumin: 4.2 g/dL (ref 3.5–5.2)
Alkaline Phosphatase: 49 U/L (ref 39–117)
BUN: 17 mg/dL (ref 6–23)
CO2: 30 mEq/L (ref 19–32)
Calcium: 9.3 mg/dL (ref 8.4–10.5)
Chloride: 103 mEq/L (ref 96–112)
Creatinine, Ser: 0.81 mg/dL (ref 0.40–1.50)
GFR: 94.72 mL/min (ref >60.00)
Glucose, Bld: 89 mg/dL (ref 70–99)
Potassium: 3.9 mEq/L (ref 3.5–5.1)
Sodium: 138 mEq/L (ref 135–145)
Total Bilirubin: 1.2 mg/dL (ref 0.2–1.2)
Total Protein: 7.4 g/dL (ref 6.0–8.3)

## 2019-05-11 LAB — CBC WITH DIFFERENTIAL/PLATELET
Basophils Absolute: 0.1 10*3/uL (ref 0.0–0.1)
Basophils Relative: 1.2 % (ref 0.0–3.0)
Eosinophils Absolute: 0.1 10*3/uL (ref 0.0–0.7)
Eosinophils Relative: 2.2 % (ref 0.0–5.0)
HCT: 39.9 % (ref 39.0–52.0)
Hemoglobin: 13.8 g/dL (ref 13.0–17.0)
Lymphocytes Relative: 30.1 % (ref 12.0–46.0)
Lymphs Abs: 1.6 10*3/uL (ref 0.7–4.0)
MCHC: 34.6 g/dL (ref 30.0–36.0)
MCV: 90.3 fl (ref 78.0–100.0)
Monocytes Absolute: 0.6 10*3/uL (ref 0.1–1.0)
Monocytes Relative: 11 % (ref 3.0–12.0)
Neutro Abs: 2.9 10*3/uL (ref 1.4–7.7)
Neutrophils Relative %: 55.5 % (ref 43.0–77.0)
Platelets: 217 10*3/uL (ref 150.0–400.0)
RBC: 4.42 Mil/uL (ref 4.22–5.81)
RDW: 13.2 % (ref 11.5–15.5)
WBC: 5.2 10*3/uL (ref 4.0–10.5)

## 2019-05-11 LAB — HIGH SENSITIVITY CRP: CRP, High Sensitivity: 0.62 mg/L (ref 0.000–5.000)

## 2019-05-19 LAB — SERIAL MONITORING

## 2019-05-25 LAB — ADALIMUMAB+AB (SERIAL MONITOR)
Adalimumab Drug Level: <0.6 ug/mL
Anti-Adalimumab Antibody: 1825 ng/mL

## 2019-05-29 ENCOUNTER — Telehealth: Payer: Self-pay | Admitting: Pharmacist

## 2019-05-29 NOTE — Telephone Encounter (Signed)
Called patient to schedule an appointment for the [facility]. I was unable to reach the patient so I left a HIPAA-compliant message requesting that the patient return my call.

## 2019-05-30 MED FILL — BD 3 ML SYRINGE 25GX1: 25G X 1" | 15 days supply | Qty: 15 | Fill #0

## 2019-05-30 MED FILL — BD 3 ML SYRINGE 25GX1": 25G X 1" | 15 days supply | Qty: 15 | Fill #0

## 2019-05-31 ENCOUNTER — Other Ambulatory Visit: Payer: Self-pay

## 2019-05-31 ENCOUNTER — Ambulatory Visit: Payer: No Typology Code available for payment source | Admitting: Internal Medicine

## 2019-05-31 ENCOUNTER — Encounter: Payer: Self-pay | Admitting: Internal Medicine

## 2019-05-31 VITALS — BP 126/78 | HR 62 | Temp 96.8°F | Ht 73.0 in | Wt 145.5 lb

## 2019-05-31 DIAGNOSIS — R197 Diarrhea, unspecified: Secondary | ICD-10-CM | POA: Diagnosis not present

## 2019-05-31 DIAGNOSIS — K52832 Lymphocytic colitis: Secondary | ICD-10-CM

## 2019-05-31 DIAGNOSIS — R159 Full incontinence of feces: Secondary | ICD-10-CM

## 2019-05-31 DIAGNOSIS — K52839 Microscopic colitis, unspecified: Secondary | ICD-10-CM

## 2019-05-31 DIAGNOSIS — K51919 Ulcerative colitis, unspecified with unspecified complications: Secondary | ICD-10-CM

## 2019-05-31 NOTE — Patient Instructions (Signed)
As discussed with Dr. Henrene Pastor, discontinue your Humira.  His nurse, Rosanne Sack, will call you to initiate Entyvio.

## 2019-05-31 NOTE — Progress Notes (Signed)
HISTORY OF PRESENT ILLNESS:  Frank Larson is a 69 y.o. male with a history of lymphocytic colitis diagnosed on several occasions with colonoscopy and biopsies, most recently January 2020.  His symptoms were refractory to budesonide and antidiarrheals.  He was subsequently started on Humira.  Due to suboptimal response drug and antibody levels directed an increase from Humira 40 mg every other week to 40 mg once weekly.  Thereafter he seemed to be doing well until his telehealth visit April 30, 2019.  He has continued to have problems with multiple loose stools and incontinence.  Also some cramping abdominal discomfort.  No bleeding.  Repeat drug and antibody levels recently revealed undetectable drug level and high antibody level.  He is here today to discuss other options.  No new complaints other than those outlined above.  Blood work from May 11, 2019 revealed normal comprehensive metabolic panel.  Normal C-reactive protein.  Normal CBC with differential.  Adalimumab drug level less than 0.6 and antibody level 1,825.  REVIEW OF SYSTEMS:  All non-GI ROS negative unless otherwise stated in the HPI except for sinus and allergy, sleeping problems, hearing problems  Past Medical History:  Diagnosis Date  . Allergy   . Arthritis   . Colitis   . Diverticulosis   . Hyperlipidemia    off medicines currently   . Metatarsal fracture    right    Past Surgical History:  Procedure Laterality Date  . COLONOSCOPY  08/07/2002  . INGUINAL HERNIA REPAIR    . NASAL SINUS SURGERY    . NOSE SURGERY     broken nose    Social History Frank Larson  reports that he has never smoked. He has never used smokeless tobacco. He reports current alcohol use of about 4.0 standard drinks of alcohol per week. He reports that he does not use drugs.  family history includes Colitis in his father; Heart disease in his father; Hyperlipidemia in his father; Parkinson's disease in his mother; Peptic Ulcer Disease in  his father and mother.  No Known Allergies     PHYSICAL EXAMINATION: Vital signs: BP 126/78 (BP Location: Left Arm, Patient Position: Sitting, Cuff Size: Normal)   Pulse 62   Temp (!) 96.8 F (36 C)   Ht 6' 1"  (1.854 m)   Wt 145 lb 8 oz (66 kg)   SpO2 100%   BMI 19.20 kg/m   Constitutional: generally well-appearing, no acute distress Psychiatric: alert and oriented x3, cooperative Eyes: Anicteric Abdomen: Not reexamined  ASSESSMENT:  1.  Lymphocytic colitis with associated diarrhea and incontinence.  Previously refractory to budesonide and antidiarrheals.  Initial response to Humira but has lost that response due to the development of high levels of antibodies with associated undetectable drug level.  We discussed therapeutic options.  Focus specifically on changing biologic agent.   PLAN:  1.  Initiate Entyvio (vedolizumab).  300 mg IV weeks 0, 2, and 6.  Every 8 weeks thereafter.  We discussed the medication effects, side effects, and risks. 2.  Resume budesonide 9 mg daily.  Possibly this will help some, though incompletely effective in the past. 3.  Antidiarrheals as needed 4.  Routine office follow-up in 3 months.  Contact the office in the interim for any questions or problems A total time of 30 minutes was spent preparing to see the patient.  Reviewing test results, obtaining history, performing appropriate exam, counseling the patient regarding his refractory lymphocytic colitis, ordering medications including the new biologic agent,  and documenting clinical information in the health record.

## 2019-06-22 MED FILL — VALSARTAN 80 MG TABLET: 80 | 60 days supply | Qty: 30 | Fill #0

## 2019-07-11 ENCOUNTER — Ambulatory Visit: Payer: No Typology Code available for payment source | Admitting: Internal Medicine

## 2019-07-11 ENCOUNTER — Other Ambulatory Visit (HOSPITAL_COMMUNITY): Payer: Self-pay | Admitting: Internal Medicine

## 2019-07-11 MED FILL — NITROGLYCERIN 0.4 MG TAB SL: 0.4 | 30 days supply | Qty: 25 | Fill #0

## 2019-07-11 MED FILL — ASPIRIN 81 MG TBEC: 81 | 30 days supply | Qty: 30 | Fill #0

## 2019-07-11 MED FILL — ATORVASTATIN 20 MG TABLET: 20 | 90 days supply | Qty: 90 | Fill #0

## 2019-07-12 ENCOUNTER — Telehealth: Payer: Self-pay | Admitting: Interventional Cardiology

## 2019-07-12 NOTE — Telephone Encounter (Signed)
Left message to call office

## 2019-07-12 NOTE — Telephone Encounter (Signed)
°  Pt said he was advised by pcp he will have stress test prior seeing Dr. Tamala Julian. He wanted to know if he needs the test or see Dr. Tamala Julian first since he is a new patient.  Please advise

## 2019-07-17 MED FILL — CYANOCOBALAMIN 1,000 MCG/ML: 1000 | 84 days supply | Qty: 3 | Fill #1

## 2019-07-18 NOTE — Telephone Encounter (Signed)
Left message to call back  

## 2019-07-18 NOTE — Telephone Encounter (Signed)
Spoke with pt and he states he may have misunderstood his PCP but he thought Dr. Inda Merlin told him to go ahead and get scheduled for a stress test prior to seeing Dr. Tamala Julian.  Advised pt we wouldn't order a stress test until he seen.  Made him aware that PCP can order the stress test and have it completed prior to his appt here if he would like, otherwise, we would order appropriate testing at the time of his appt.  Pt appreciative for call.

## 2019-07-31 MED FILL — VALSARTAN 80 MG TABLET: 80 | 30 days supply | Qty: 30 | Fill #0

## 2019-08-06 NOTE — Progress Notes (Signed)
Cardiology Office Note:    Date:  08/07/2019   ID:  Frank Larson, DOB March 21, 1951, MRN [id number]  PCP:  Josetta Huddle, MD  Cardiologist:  No primary care provider on file.   Referring MD: Josetta Huddle, MD   Chief Complaint  Patient presents with  . Chest Pain  . Shortness of Breath    History of Present Illness:    Frank Larson is a 69 y.o. male with a hx of hyperlipidemia referred from Dr. Inda Merlin because of exertional intolerance, dyspnea, and episodes of throat discomfort.  The patient has a significant family history of CAD.  His father began having ischemic events and angioplasties in his early 55s.  He was diabetic, smoker.  [name] has a lifelong history of smoking abstinence, exercise, and weight control.  He has no chronic medical conditions.  He has noticed recent exertional intolerance with associated dyspnea.  He has had regular exercise although recently notes that physical activity is not is well-tolerated.  There is no associated shortness of breath.  He randomly has episodes of throat discomfort that are not exertion related.  Risk factors for coronary disease include hyperlipidemia, hypertension, and family history.  Past Medical History:  Diagnosis Date  . Allergy   . Arthritis   . Colitis   . Diverticulosis   . Hyperlipidemia    off medicines currently   . Metatarsal fracture    right    Past Surgical History:  Procedure Laterality Date  . COLONOSCOPY  08/07/2002  . INGUINAL HERNIA REPAIR    . NASAL SINUS SURGERY    . NOSE SURGERY     broken nose    Current Medications: Current Meds  Medication Sig  . aspirin 81 MG EC tablet Take 81 mg by mouth daily.  Marland Kitchen atorvastatin (LIPITOR) 20 MG tablet Take 20 mg by mouth daily.  . Cholecalciferol (VITAMIN D3) 50 MCG (2000 UT) capsule Take by mouth.  . cyanocobalamin (,VITAMIN B-12,) 1000 MCG/ML injection INJECT 1ML ONCE MONTHLY  . diphenoxylate-atropine (LOMOTIL) 2.5-0.025 MG tablet TAKE 1 TABLET BY MOUTH  FOUR TIMES DAILY AS NEEDED FOR DIARRHEA OR LOOSE STOOLS  . fluticasone (FLONASE) 50 MCG/ACT nasal spray Place 2 sprays into both nostrils daily.  . nitroGLYCERIN (NITROSTAT) 0.4 MG SL tablet Place 1 tablet under the tongue every 5 (five) minutes x 3 doses as needed.  . valsartan (DIOVAN) 80 MG tablet Take 80 mg by mouth daily.     Allergies:   Patient has no known allergies.   Social History   Socioeconomic History  . Marital status: Married    Spouse name: Not on file  . Number of children: 1  . Years of education: Not on file  . Highest education level: Not on file  Occupational History  . Occupation: psychologist  Tobacco Use  . Smoking status: Never Smoker  . Smokeless tobacco: Never Used  Substance and Sexual Activity  . Alcohol use: Yes    Alcohol/week: 4.0 standard drinks    Types: 4 Glasses of wine per week    Comment: social  . Drug use: No  . Sexual activity: Yes  Other Topics Concern  . Not on file  Social History Narrative   Daily caffeine----regular exercise            Social Determinants of Health   Financial Resource Strain:   . Difficulty of Paying Living Expenses:   Food Insecurity:   . Worried About Charity fundraiser in the Last  Year:   . Ran Out of Food in the Last Year:   Transportation Needs:   . Film/video editor (Medical):   Marland Kitchen Lack of Transportation (Non-Medical):   Physical Activity:   . Days of Exercise per Week:   . Minutes of Exercise per Session:   Stress:   . Feeling of Stress :   Social Connections:   . Frequency of Communication with Friends and Family:   . Frequency of Social Gatherings with Friends and Family:   . Attends Religious Services:   . Active Member of Clubs or Organizations:   . Attends Archivist Meetings:   Marland Kitchen Marital Status:      Family History: The patient's family history includes Colitis in his father; Heart disease in his father; Hyperlipidemia in his father; Parkinson's disease in his  mother; Peptic Ulcer Disease in his father and mother. There is no history of Colon cancer, Diabetes, Colon polyps, Esophageal cancer, Rectal cancer, or Stomach cancer.  ROS:   Please see the history of present illness.    There are no symptoms of claudication.  He has never had a stroke or other vascular issue.  Was on statin therapy but this was discontinued by his primary care although more recently it is been resumed.  All other systems reviewed and are negative.  EKGs/Labs/Other Studies Reviewed:    The following studies were reviewed today: No cardiac studies have ever been performed  EKG:  EKG normal sinus rhythm, left atrial abnormality, left anterior hemiblock, incomplete right bundle branch block is noted.  No prior tracing to compare.  This tracing is unchanged when compared to a May 01, 2019 tracing at [place].  Recent Labs: 05/11/2019: ALT 14; BUN 17; Creatinine, Ser 0.81; Hemoglobin 13.8; Platelets 217.0; Potassium 3.9; Sodium 138  Recent Lipid Panel    Component Value Date/Time   CHOL 175 11/30/2012 0814   TRIG 54.0 11/30/2012 0814   HDL 69.10 11/30/2012 0814   CHOLHDL 3 11/30/2012 0814   VLDL 10.8 11/30/2012 0814   LDLCALC 95 11/30/2012 0814    Physical Exam:    VS:  BP 108/62   Pulse (!) 56   Ht 6' 1"  (1.854 m)   Wt 144 lb 12.8 oz (65.7 kg)   SpO2 97%   BMI 19.10 kg/m     Wt Readings from Last 3 Encounters:  08/07/19 144 lb 12.8 oz (65.7 kg)  05/31/19 145 lb 8 oz (66 kg)  04/30/19 148 lb (67.1 kg)     GEN: Slender and healthy in appearance. No acute distress HEENT: Normal NECK: No JVD. LYMPHATICS: No lymphadenopathy CARDIAC:  RRR without murmur, gallop, or edema. VASCULAR:  Normal Pulses. No bruits. RESPIRATORY:  Clear to auscultation without rales, wheezing or rhonchi  ABDOMEN: Soft, non-tender, non-distended, No pulsatile mass, MUSCULOSKELETAL: No deformity  SKIN: Warm and dry NEUROLOGIC:  Alert and oriented x 3 PSYCHIATRIC:  Normal affect    ASSESSMENT:    1. DOE (dyspnea on exertion)   2. Hyperlipidemia LDL goal <70   3. Essential hypertension   4. Educated about COVID-19 virus infection   5. Throat discomfort    PLAN:    In order of problems listed above:  1. Under the circumstances in this patient with hyperlipidemia, hypertension, positive family history, random episodes of throat discomfort and new dyspnea on exertion, coronary artery disease needs to be excluded.  He has no other risk factors such as cigarette smoking or diabetes.  Symptoms are somewhat atypical.  Probability of CAD is low to intermediate.  Plan coronary CTA with FFR if indicated. 2. Target LDL given history and other risk factors should be less than 70.  Continue high intensity statin therapy likely needing to increase atorvastatin to 40 or 80 mg/day. 3. Target blood pressure 130/80 mmHg or less.  Continue valsartan 80 mg/day. 4. Covid vaccine has been received and social distancing is being practiced. 5. This is a random complaint, nonexertional, but somewhat concerning and without significant explanation.  An ischemic equivalent is being considered.   Medication Adjustments/Labs and Tests Ordered: Current medicines are reviewed at length with the patient today.  Concerns regarding medicines are outlined above.  Orders Placed This Encounter  Procedures  . CT CORONARY MORPH W/CTA COR W/SCORE W/CA W/CM &/OR WO/CM  . CT CORONARY FRACTIONAL FLOW RESERVE DATA PREP  . CT CORONARY FRACTIONAL FLOW RESERVE FLUID ANALYSIS  . EKG 12-Lead   Meds ordered this encounter  Medications  . metoprolol tartrate (LOPRESSOR) 50 MG tablet    Sig: Take one tablet by mouth 2 hours prior to your CT    Dispense:  1 tablet    Refill:  0    Patient Instructions  Medication Instructions:  Your physician recommends that you continue on your current medications as directed. Please refer to the Current Medication list given to you today.  *If you need a refill on your  cardiac medications before your next appointment, please call your pharmacy*   Lab Work: None   If you have labs (blood work) drawn today and your tests are completely normal, you will receive your results only by: Marland Kitchen MyChart Message (if you have MyChart) OR . A paper copy in the mail If you have any lab test that is abnormal or we need to change your treatment, we will call you to review the results.   Testing/Procedures: Your physician recommends that you have a Coronary CT performed.    Follow-Up: At [facility], you and your health needs are our priority.  As part of our continuing mission to provide you with exceptional heart care, we have created designated Provider Care Teams.  These Care Teams include your primary Cardiologist (physician) and Advanced Practice Providers (APPs -  Physician Assistants and Nurse Practitioners) who all work together to provide you with the care you need, when you need it.  We recommend signing up for the patient portal called "MyChart".  Sign up information is provided on this After Visit Summary.  MyChart is used to connect with patients for Virtual Visits (Telemedicine).  Patients are able to view lab/test results, encounter notes, upcoming appointments, etc.  Non-urgent messages can be sent to your provider as well.   To learn more about what you can do with MyChart, go to [email or web address].    Your next appointment:   As needed  The format for your next appointment:   In Person  Provider:   You may see Dr. Daneen Schick or one of the following Advanced Practice Providers on your designated Care Team:    Truitt Merle, NP  Cecilie Kicks, NP  Kathyrn Drown, NP    Other Instructions  Your cardiac CT will be scheduled at one of the below locations:   [facility] [address] [place], [place] [address] [phone]  [place] [address] [place],  [place] [address] [phone]  If scheduled at [facility], please arrive at the [facility] main  entrance of [facility] 30 minutes prior to test start time. Proceed to the [facility] Radiology Department (first floor) to check-in and test prep.  If scheduled at [facility], please arrive 15 mins early for check-in and test prep.  Please follow these instructions carefully (unless otherwise directed):  Hold all erectile dysfunction medications at least 3 days (72 hrs) prior to test.  On the Night Before the Test: . Be sure to Drink plenty of water. . Do not consume any caffeinated/decaffeinated beverages or chocolate 12 hours prior to your test. . Do not take any antihistamines 12 hours prior to your test.  On the Day of the Test: . Drink plenty of water. Do not drink any water within one hour of the test. . Do not eat any food 4 hours prior to the test. . You may take your regular medications prior to the test.  . Take metoprolol (Lopressor) two hours prior to test.       After the Test: . Drink plenty of water. . After receiving IV contrast, you may experience a mild flushed feeling. This is normal. . On occasion, you may experience a mild rash up to 24 hours after the test. This is not dangerous. If this occurs, you can take Benadryl 25 mg and increase your fluid intake. . If you experience trouble breathing, this can be serious. If it is severe call 911 IMMEDIATELY. If it is mild, please call our office. . If you take any of these medications: Glipizide/Metformin, Avandament, Glucavance, please do not take 48 hours after completing test unless otherwise instructed.   Once we have confirmed authorization from your insurance company, we will call you to set up a date and time for your test.   For non-scheduling related questions, please contact the cardiac imaging nurse navigator should you have any questions/concerns: Marchia Bond, RN  Navigator Cardiac Imaging [facility] [phone] office  For scheduling needs, including cancellations and rescheduling, please call [phone].        Signed, Sinclair Grooms, MD  08/07/2019 5:26 PM    [place]

## 2019-08-07 ENCOUNTER — Encounter: Payer: Self-pay | Admitting: Interventional Cardiology

## 2019-08-07 ENCOUNTER — Other Ambulatory Visit: Payer: Self-pay

## 2019-08-07 ENCOUNTER — Ambulatory Visit: Payer: No Typology Code available for payment source | Admitting: Interventional Cardiology

## 2019-08-07 VITALS — BP 108/62 | HR 56 | Ht 73.0 in | Wt 144.8 lb

## 2019-08-07 DIAGNOSIS — Z7189 Other specified counseling: Secondary | ICD-10-CM

## 2019-08-07 DIAGNOSIS — R0609 Other forms of dyspnea: Secondary | ICD-10-CM

## 2019-08-07 DIAGNOSIS — I10 Essential (primary) hypertension: Secondary | ICD-10-CM | POA: Diagnosis not present

## 2019-08-07 DIAGNOSIS — E785 Hyperlipidemia, unspecified: Secondary | ICD-10-CM

## 2019-08-07 DIAGNOSIS — R0600 Dyspnea, unspecified: Secondary | ICD-10-CM | POA: Diagnosis not present

## 2019-08-07 DIAGNOSIS — R070 Pain in throat: Secondary | ICD-10-CM

## 2019-08-07 MED ORDER — METOPROLOL TARTRATE 50 MG PO TABS
ORAL_TABLET | ORAL | 0 refills | Status: DC
Start: 2019-08-07 — End: 2021-05-28

## 2019-08-07 NOTE — Patient Instructions (Addendum)
Medication Instructions:  Your physician recommends that you continue on your current medications as directed. Please refer to the Current Medication list given to you today.  *If you need a refill on your cardiac medications before your next appointment, please call your pharmacy*   Lab Work: None   If you have labs (blood work) drawn today and your tests are completely normal, you will receive your results only by: Marland Kitchen MyChart Message (if you have MyChart) OR . A paper copy in the mail If you have any lab test that is abnormal or we need to change your treatment, we will call you to review the results.   Testing/Procedures: Your physician recommends that you have a Coronary CT performed.    Follow-Up: At [facility], you and your health needs are our priority.  As part of our continuing mission to provide you with exceptional heart care, we have created designated Provider Care Teams.  These Care Teams include your primary Cardiologist (physician) and Advanced Practice Providers (APPs -  Physician Assistants and Nurse Practitioners) who all work together to provide you with the care you need, when you need it.  We recommend signing up for the patient portal called "MyChart".  Sign up information is provided on this After Visit Summary.  MyChart is used to connect with patients for Virtual Visits (Telemedicine).  Patients are able to view lab/test results, encounter notes, upcoming appointments, etc.  Non-urgent messages can be sent to your provider as well.   To learn more about what you can do with MyChart, go to [email or web address].    Your next appointment:   As needed  The format for your next appointment:   In Person  Provider:   You may see Dr. Daneen Schick or one of the following Advanced Practice Providers on your designated Care Team:    Truitt Merle, NP  Cecilie Kicks, NP  Kathyrn Drown, NP    Other Instructions  Your cardiac CT will be scheduled at one of  the below locations:   [facility] [address] [place], [place] [address] [phone]  [place] [address] [place], [place] [address] [phone]  If scheduled at [facility], please arrive at the [facility] main entrance of [facility] 30 minutes prior to test start time. Proceed to the [facility] Radiology Department (first floor) to check-in and test prep.  If scheduled at [facility], please arrive 15 mins early for check-in and test prep.  Please follow these instructions carefully (unless otherwise directed):  Hold all erectile dysfunction medications at least 3 days (72 hrs) prior to test.  On the Night Before the Test: . Be sure to Drink plenty of water. . Do not consume any caffeinated/decaffeinated beverages or chocolate 12 hours prior to your test. . Do not take any antihistamines 12 hours prior to your test.  On the Day of the Test: . Drink plenty of water. Do not drink any water within one hour of the test. . Do not eat any food 4 hours prior to the test. . You may take your regular medications prior to the test.  . Take metoprolol (Lopressor) two hours prior to test.       After the Test: . Drink plenty of water. . After receiving IV contrast, you may experience a mild flushed feeling. This is normal. . On occasion, you may experience a mild rash up to 24  hours after the test. This is not dangerous. If this occurs, you can take Benadryl 25 mg and increase your fluid intake. . If you experience trouble breathing, this can be serious. If it is severe call 911 IMMEDIATELY. If it is mild, please call our office. . If you take any of these medications: Glipizide/Metformin, Avandament, Glucavance, please do not take 48 hours after completing test unless otherwise instructed.   Once we have confirmed authorization from your insurance company, we will  call you to set up a date and time for your test.   For non-scheduling related questions, please contact the cardiac imaging nurse navigator should you have any questions/concerns: Marchia Bond, RN Navigator Cardiac Imaging [facility] [phone] office  For scheduling needs, including cancellations and rescheduling, please call [phone].

## 2019-08-20 ENCOUNTER — Other Ambulatory Visit: Payer: Self-pay | Admitting: *Deleted

## 2019-08-20 DIAGNOSIS — R0600 Dyspnea, unspecified: Secondary | ICD-10-CM

## 2019-08-20 DIAGNOSIS — R0609 Other forms of dyspnea: Secondary | ICD-10-CM

## 2019-08-21 ENCOUNTER — Other Ambulatory Visit: Payer: No Typology Code available for payment source | Admitting: *Deleted

## 2019-08-21 ENCOUNTER — Telehealth (HOSPITAL_COMMUNITY): Payer: Self-pay | Admitting: *Deleted

## 2019-08-21 ENCOUNTER — Other Ambulatory Visit: Payer: Self-pay

## 2019-08-21 DIAGNOSIS — R0609 Other forms of dyspnea: Secondary | ICD-10-CM

## 2019-08-21 DIAGNOSIS — R0600 Dyspnea, unspecified: Secondary | ICD-10-CM

## 2019-08-21 LAB — BASIC METABOLIC PANEL
BUN/Creatinine Ratio: 16 (ref 10–24)
BUN: 15 mg/dL (ref 8–27)
CO2: 24 mmol/L (ref 20–29)
Calcium: 9.4 mg/dL (ref 8.6–10.2)
Chloride: 105 mmol/L (ref 96–106)
Creatinine, Ser: 0.96 mg/dL (ref 0.76–1.27)
GFR calc Af Amer: 94 mL/min/{1.73_m2} (ref >59)
GFR calc non Af Amer: 81 mL/min/{1.73_m2} (ref >59)
Glucose: 75 mg/dL (ref 65–99)
Potassium: 4.2 mmol/L (ref 3.5–5.2)
Sodium: 143 mmol/L (ref 134–144)

## 2019-08-21 NOTE — Telephone Encounter (Signed)
Reaching out to patient to offer assistance regarding upcoming cardiac imaging study; pt verbalizes understanding of appt date/time, parking situation and where to check in, pre-test NPO status and medications ordered, and verified current allergies; name and call back number provided for further questions should they arise  Karsynn Deweese Tai RN Navigator Cardiac Imaging [facility] [phone] office [phone] cell 

## 2019-08-22 ENCOUNTER — Ambulatory Visit (HOSPITAL_COMMUNITY)
Admission: RE | Admit: 2019-08-22 | Discharge: 2019-08-22 | Disposition: A | Discharge status: Home / Self Care | Payer: No Typology Code available for payment source | Source: Ambulatory Visit | Attending: Interventional Cardiology | Admitting: Interventional Cardiology

## 2019-08-22 DIAGNOSIS — R0600 Dyspnea, unspecified: Secondary | ICD-10-CM | POA: Diagnosis not present

## 2019-08-22 DIAGNOSIS — R0609 Other forms of dyspnea: Secondary | ICD-10-CM

## 2019-08-22 MED ORDER — IOHEXOL 350 MG/ML SOLN
80.0000 mL | Freq: Once | INTRAVENOUS | Status: AC | PRN
  Administered 2019-08-22: 80 mL via INTRAVENOUS

## 2019-08-22 MED ORDER — NITROGLYCERIN 0.4 MG SL SUBL: 0.8000 mg | SUBLINGUAL_TABLET | Freq: Once | SUBLINGUAL | Status: AC

## 2019-08-22 MED ORDER — NITROGLYCERIN 0.4 MG SL SUBL
SUBLINGUAL_TABLET | SUBLINGUAL | Status: AC
  Administered 2019-08-22: 0.8 mg via SUBLINGUAL
  Filled 2019-08-22: qty 2

## 2019-08-28 MED FILL — VALSARTAN 80 MG TABLET: 80 | 30 days supply | Qty: 30 | Fill #1

## 2019-08-28 MED FILL — DIPHENOXYLATE-ATROPINE 2.5-: 2.5-0.025 | 90 days supply | Qty: 360 | Fill #1

## 2019-09-25 MED FILL — VALSARTAN 80 MG TABLET: 80 | 30 days supply | Qty: 30 | Fill #0

## 2019-10-04 MED FILL — ATORVASTATIN 20 MG TABLET: 20 | 90 days supply | Qty: 90 | Fill #1

## 2019-10-22 MED FILL — CYANOCOBALAMIN 1,000 MCG/ML: 1000 | 84 days supply | Qty: 3 | Fill #2

## 2019-10-31 MED FILL — ALBUTEROL SULFATE HFA 108 (: 108 (90 BAS | 90 days supply | Qty: 36 | Fill #0

## 2019-11-02 MED FILL — VALSARTAN 80 MG TABLET: 80 | 30 days supply | Qty: 30 | Fill #1

## 2019-11-13 ENCOUNTER — Ambulatory Visit: Payer: No Typology Code available for payment source | Admitting: Sports Medicine

## 2019-11-27 ENCOUNTER — Ambulatory Visit: Payer: No Typology Code available for payment source | Admitting: Sports Medicine

## 2019-12-06 ENCOUNTER — Other Ambulatory Visit (HOSPITAL_COMMUNITY): Payer: Self-pay | Admitting: Internal Medicine

## 2019-12-06 MED FILL — VALSARTAN 80 MG TABS: 80 | 90 days supply | Qty: 90 | Fill #0

## 2019-12-31 MED FILL — ATORVASTATIN CALCIUM 20 MG: 20 | 90 days supply | Qty: 90 | Fill #2

## 2020-01-04 ENCOUNTER — Other Ambulatory Visit (HOSPITAL_COMMUNITY): Payer: Self-pay | Admitting: Internal Medicine

## 2020-01-04 MED FILL — ZOLPIDEM TARTRATE 10 MG TAB: 10 | 30 days supply | Qty: 30 | Fill #0

## 2020-01-18 MED FILL — FLUTICASONE PROP 50 MCG SPR: 50 | 30 days supply | Qty: 16 | Fill #1

## 2020-01-23 MED FILL — CYANOCOBALAMIN 1,000 MCG/ML: 1000 | 84 days supply | Qty: 3 | Fill #0

## 2020-03-08 ENCOUNTER — Other Ambulatory Visit: Payer: Self-pay | Admitting: Internal Medicine

## 2020-03-08 MED FILL — VALSARTAN 80 MG TABS: 80 | 90 days supply | Qty: 90 | Fill #1

## 2020-03-08 MED FILL — ZOLPIDEM TARTRATE 10 MG TAB: 10 | 30 days supply | Qty: 30 | Fill #1

## 2020-03-08 MED FILL — FLUTICASONE PROP 50 MCG SPR: 50 | 30 days supply | Qty: 16 | Fill #2

## 2020-03-11 ENCOUNTER — Other Ambulatory Visit (HOSPITAL_COMMUNITY): Payer: Self-pay | Admitting: Internal Medicine

## 2020-03-11 MED FILL — DIPHENOXYLATE-ATROPINE 2.5-: 2.5-0.025 | 90 days supply | Qty: 540 | Fill #0

## 2020-04-03 MED FILL — ZOLPIDEM TARTRATE 10 MG TAB: 10 | 30 days supply | Qty: 30 | Fill #2

## 2020-04-03 MED FILL — ATORVASTATIN CALCIUM 20 MG: 20 | 90 days supply | Qty: 90 | Fill #3

## 2020-04-25 MED FILL — CYANOCOBALAMIN 1,000 MCG/ML: 1000 | 84 days supply | Qty: 3 | Fill #1

## 2020-05-07 ENCOUNTER — Other Ambulatory Visit (HOSPITAL_COMMUNITY): Payer: Self-pay | Admitting: Internal Medicine

## 2020-05-07 MED FILL — FLUTICASONE PROP 50 MCG SPR: 50 | 30 days supply | Qty: 16 | Fill #0

## 2020-05-19 ENCOUNTER — Other Ambulatory Visit (HOSPITAL_COMMUNITY): Payer: Self-pay | Admitting: Internal Medicine

## 2020-05-19 MED FILL — ZOLPIDEM TARTRATE 10 MG TAB: 10 | 30 days supply | Qty: 30 | Fill #0

## 2020-05-19 MED FILL — ALBUTEROL SULFATE HFA 108 (: 108 (90 BAS | 90 days supply | Qty: 36 | Fill #0

## 2020-05-19 MED FILL — HYDROCHLOROTHIAZIDE 12.5 MG: 12.5 | 90 days supply | Qty: 90 | Fill #0

## 2020-06-12 ENCOUNTER — Other Ambulatory Visit (HOSPITAL_COMMUNITY): Payer: Self-pay | Admitting: Internal Medicine

## 2020-06-17 MED FILL — ATORVASTATIN CALCIUM 20 MG: 20 | 90 days supply | Qty: 90 | Fill #0

## 2020-07-09 ENCOUNTER — Other Ambulatory Visit (HOSPITAL_COMMUNITY): Payer: Self-pay

## 2020-07-09 MED ORDER — DIPHENOXYLATE-ATROPINE 2.5-0.025 MG PO TABS
ORAL_TABLET | ORAL | 1 refills | Status: DC
  Filled 2020-07-09: qty 540, 90d supply, fill #0

## 2020-07-09 MED ORDER — CYANOCOBALAMIN 1000 MCG/ML IJ SOLN
INTRAMUSCULAR | 5 refills | Status: AC
  Filled 2020-07-09: qty 3, 84d supply, fill #0

## 2020-07-09 MED ORDER — AMLODIPINE BESYLATE 2.5 MG PO TABS
ORAL_TABLET | ORAL | 3 refills | Status: AC
  Filled 2020-07-09 – 2020-07-22 (×2): qty 90, 90d supply, fill #0

## 2020-07-10 ENCOUNTER — Other Ambulatory Visit (HOSPITAL_COMMUNITY): Payer: Self-pay

## 2020-07-10 MED ORDER — SILDENAFIL CITRATE 20 MG PO TABS
ORAL_TABLET | ORAL | 5 refills | Status: DC
  Filled 2020-07-10: qty 30, 30d supply, fill #0

## 2020-07-11 ENCOUNTER — Other Ambulatory Visit (HOSPITAL_COMMUNITY): Payer: Self-pay

## 2020-07-14 ENCOUNTER — Other Ambulatory Visit (HOSPITAL_COMMUNITY): Payer: Self-pay

## 2020-07-14 MED FILL — Valsartan Tab 80 MG: ORAL | 30 days supply | Qty: 30 | Fill #0 | Status: AC

## 2020-07-22 ENCOUNTER — Other Ambulatory Visit (HOSPITAL_COMMUNITY): Payer: Self-pay

## 2020-08-04 ENCOUNTER — Other Ambulatory Visit (HOSPITAL_COMMUNITY): Payer: Self-pay

## 2020-08-12 ENCOUNTER — Other Ambulatory Visit: Payer: Self-pay

## 2020-08-12 ENCOUNTER — Ambulatory Visit (INDEPENDENT_AMBULATORY_CARE_PROVIDER_SITE_OTHER): Payer: No Typology Code available for payment source | Admitting: Sports Medicine

## 2020-08-12 DIAGNOSIS — M216X9 Other acquired deformities of unspecified foot: Secondary | ICD-10-CM

## 2020-08-12 NOTE — Assessment & Plan Note (Signed)
He has done very well in orthotics First advised to get these when he was in his 26s Current pair is lasted about 9 years but does need replacement New orthotics made today and he can return as needed

## 2020-08-12 NOTE — Progress Notes (Signed)
Chief complaint bilateral forefoot pain  Patient returns for making a new pair of orthotics His current pair were made about 9 years ago They have held up very well with 2 small metatarsal pads We also put a metatarsal bar cushion He really is not comfortable walking or doing other activities without these  Exam Thin male in no acute distress BP 140/74   Ht 6' 1"  (1.854 m)   Wt 147 lb (66.7 kg)   BMI 19.39 kg/m  No flowsheet data found.  Flattening of transverse arch He has preservation of long arch although his long arch used to be cavus Splaying between toes 1 and 2 on the right  Patient was fitted for a : standard, cushioned, semi-rigid orthotic. The orthotic was heated and afterward the patient stood on the orthotic blank positioned on the orthotic stand. The patient was positioned in subtalar neutral position and 10 degrees of ankle dorsiflexion in a weight bearing stance. After completion of molding, a stable base was applied to the orthotic blank. The blank was ground to a stable position for weight bearing. Size: 11 medium orange blank Base: blue med. EVA Posting: small MT pads Additional orthotic padding: blue foam MT bar

## 2020-08-26 ENCOUNTER — Other Ambulatory Visit (HOSPITAL_COMMUNITY): Payer: Self-pay

## 2020-09-22 ENCOUNTER — Other Ambulatory Visit (HOSPITAL_COMMUNITY): Payer: Self-pay

## 2020-09-22 MED FILL — Fluticasone Propionate Nasal Susp 50 MCG/ACT: NASAL | 30 days supply | Qty: 16 | Fill #0 | Status: AC

## 2020-09-22 MED FILL — Valsartan Tab 80 MG: ORAL | 30 days supply | Qty: 30 | Fill #1 | Status: AC

## 2020-09-22 MED FILL — Zolpidem Tartrate Tab 10 MG: ORAL | 30 days supply | Qty: 30 | Fill #0 | Status: AC

## 2020-09-22 MED FILL — Albuterol Sulfate Inhal Aero 108 MCG/ACT (90MCG Base Equiv): RESPIRATORY_TRACT | 30 days supply | Qty: 18 | Fill #0 | Status: AC

## 2020-09-22 MED FILL — Atorvastatin Calcium Tab 20 MG (Base Equivalent): ORAL | 90 days supply | Qty: 90 | Fill #0 | Status: AC

## 2020-09-24 ENCOUNTER — Other Ambulatory Visit (HOSPITAL_COMMUNITY): Payer: Self-pay

## 2020-09-24 MED ORDER — ZOSTER VAC RECOMB ADJUVANTED 50 MCG/0.5ML IM SUSR
INTRAMUSCULAR | 1 refills | Status: AC
  Filled 2020-09-24: qty 1, 30d supply, fill #0

## 2020-10-20 ENCOUNTER — Other Ambulatory Visit (HOSPITAL_COMMUNITY): Payer: Self-pay

## 2020-10-21 ENCOUNTER — Other Ambulatory Visit (HOSPITAL_COMMUNITY): Payer: Self-pay

## 2020-11-14 ENCOUNTER — Ambulatory Visit
Admission: RE | Admit: 2020-11-14 | Discharge: 2020-11-14 | Disposition: A | Discharge status: Home / Self Care | Payer: No Typology Code available for payment source | Source: Ambulatory Visit | Attending: Geriatric Medicine | Admitting: Geriatric Medicine

## 2020-11-14 ENCOUNTER — Other Ambulatory Visit: Payer: Self-pay | Admitting: Geriatric Medicine

## 2020-11-14 DIAGNOSIS — R0981 Nasal congestion: Secondary | ICD-10-CM

## 2020-12-30 ENCOUNTER — Ambulatory Visit (INDEPENDENT_AMBULATORY_CARE_PROVIDER_SITE_OTHER): Payer: Medicare Other | Admitting: Sports Medicine

## 2020-12-30 DIAGNOSIS — G8929 Other chronic pain: Secondary | ICD-10-CM | POA: Diagnosis not present

## 2020-12-30 DIAGNOSIS — M545 Low back pain, unspecified: Secondary | ICD-10-CM

## 2020-12-30 NOTE — Progress Notes (Signed)
PCP: Josetta Huddle, MD  Subjective:   HPI: Patient is a 70 y.o. male here for low back pain.  Frank Larson has a history of tightness in his low back and this started in his 20's after helping a friend move a heavy piano. He has managed to stay active and the back pain is usually not bothersome. Recently he has move to a new home and working in his yard a lot. He picks up stick and bending over causes pain. He also digs holes for plant and this seems to make pain worse. He is a swimmer and bending over to pick goggles up is another activity he noticed cause discomfort. No radiculopathy. Today he is here for further evaluation of this pain.   Past Medical History:  Diagnosis Date   Allergy    Arthritis    Colitis    Diverticulosis    Hyperlipidemia    off medicines currently    Metatarsal fracture    right    Current Outpatient Medications on File Prior to Visit  Medication Sig Dispense Refill   albuterol (VENTOLIN HFA) 108 (90 Base) MCG/ACT inhaler INHALE 1-2 PUFFS BY MOUTH AS NEEDED BEFORE EXERCISE 36 g 2   amLODipine (NORVASC) 2.5 MG tablet take 1 tablet by mouth Once a day 90 tablet 3   aspirin 81 MG EC tablet Take 81 mg by mouth daily.     atorvastatin (LIPITOR) 20 MG tablet Take 20 mg by mouth daily.     atorvastatin (LIPITOR) 20 MG tablet TAKE 1 TABLET BY MOUTH ONCE A DAY 90 tablet 3   atorvastatin (LIPITOR) 20 MG tablet TAKE 1 TABLET BY MOUTH ONCE A DAY 90 tablet 3   Cholecalciferol (VITAMIN D3) 50 MCG (2000 UT) capsule Take by mouth.     cyanocobalamin (,VITAMIN B-12,) 1000 MCG/ML injection INJECT 1ML ONCE MONTHLY 10 mL 0   cyanocobalamin (,VITAMIN B-12,) 1000 MCG/ML injection INJECT 1 ML ONCE MONTHLY 6 mL 5   diphenoxylate-atropine (LOMOTIL) 2.5-0.025 MG tablet TAKE 1 TABLET BY MOUTH FOUR TIMES DAILY AS NEEDED FOR DIARRHEA OR LOOSE STOOLS 360 tablet 1   diphenoxylate-atropine (LOMOTIL) 2.5-0.025 MG tablet TAKE 2 TABLETS BY MOUTH 3 TIMES DAILY AS NEEDED 540 tablet 1    diphenoxylate-atropine (LOMOTIL) 2.5-0.025 MG tablet take 2 tablets by mouth three times a day as needed 540 tablet 1   fluticasone (FLONASE) 50 MCG/ACT nasal spray Place 2 sprays into both nostrils daily. 48 g 3   fluticasone (FLONASE) 50 MCG/ACT nasal spray PLACE 1-2 SPRAYS IN EACH NOSTRIL ONCE A DAY AS NEEDED 16 g 5   hydrochlorothiazide (MICROZIDE) 12.5 MG capsule TAKE 1 CAPSULE BY MOUTH UPON WAKING ONCE DAILY 90 capsule 3   metoprolol tartrate (LOPRESSOR) 50 MG tablet Take one tablet by mouth 2 hours prior to your CT 1 tablet 0   nitroGLYCERIN (NITROSTAT) 0.4 MG SL tablet Place 1 tablet under the tongue every 5 (five) minutes x 3 doses as needed.     sildenafil (REVATIO) 20 MG tablet Take 2-5 tablets by mouth once a day if needed 30 tablet 5   valsartan (DIOVAN) 80 MG tablet Take 80 mg by mouth daily.     valsartan (DIOVAN) 80 MG tablet TAKE 1 TABLET BY MOUTH ONCE A DAY 30 tablet 12   zolpidem (AMBIEN) 10 MG tablet TAKE 1 TABLET BY MOUTH AT BEDTIME IF NEEDED 30 tablet 2   zolpidem (AMBIEN) 10 MG tablet TAKE 1 TABLET BY MOUTH EVERY EVENING AT BEDTIME AS NEEDED 30  tablet 2   Zoster Vaccine Adjuvanted [facility]) injection Inject into the muscle to be administered  now and 2nd dose 2-6 months later 1 each 1   No current facility-administered medications on file prior to visit.    Past Surgical History:  Procedure Laterality Date   COLONOSCOPY  08/07/2002   INGUINAL HERNIA REPAIR     NASAL SINUS SURGERY     NOSE SURGERY     broken nose    No Known Allergies  Ht 6' 1"  (1.854 m)   Wt 145 lb (65.8 kg)   BMI 19.13 kg/m   [facility] 12/30/2020  Frequency of aerobic exercise (# of days/week) 6  Average time in minutes 45  Frequency of strengthening activities (# of days/week) 2       Objective:  Physical Exam:  Gen: NAD, comfortable in exam room No deformity. Left leg is slightly shorter than right SI joint tight on right side Weakness with abduction of  right hip No tenderness to palpation. NVI distally. Negative logroll Negative faber, fadir, and piriformis stretches.   Assessment & Plan:  1. Low Back Pain: Weak gluteal muscles on right side. R leg < left leg. Heel lift provided. Patient given exercises to help improve muscle weakness which is likely cause of low back pain.   I observed and examined the patient with the resident and agree with assessment and plan.  Note reviewed and modified by me. Ila Mcgill, MD

## 2020-12-31 DIAGNOSIS — M545 Low back pain, unspecified: Secondary | ICD-10-CM | POA: Insufficient documentation

## 2020-12-31 NOTE — Assessment & Plan Note (Signed)
Work on hip abductors  Work on [employer] flexion exercises  Reck prn

## 2021-01-01 ENCOUNTER — Other Ambulatory Visit (HOSPITAL_COMMUNITY): Payer: Self-pay

## 2021-01-01 MED ORDER — ZOSTER VAC RECOMB ADJUVANTED 50 MCG/0.5ML IM SUSR
INTRAMUSCULAR | 0 refills | Status: DC
Start: 2021-01-01 — End: 2021-01-01
  Filled 2021-01-01: qty 1, 1d supply, fill #0

## 2021-01-28 ENCOUNTER — Ambulatory Visit (INDEPENDENT_AMBULATORY_CARE_PROVIDER_SITE_OTHER): Payer: Medicare Other | Admitting: Family Medicine

## 2021-01-28 ENCOUNTER — Ambulatory Visit
Admission: RE | Admit: 2021-01-28 | Discharge: 2021-01-28 | Disposition: A | Discharge status: Home / Self Care | Payer: Medicare Other | Source: Ambulatory Visit | Attending: Family Medicine | Admitting: Family Medicine

## 2021-01-28 VITALS — BP 132/70 | Ht 73.0 in | Wt 145.0 lb

## 2021-01-28 DIAGNOSIS — M545 Low back pain, unspecified: Secondary | ICD-10-CM | POA: Diagnosis present

## 2021-01-28 DIAGNOSIS — G8929 Other chronic pain: Secondary | ICD-10-CM

## 2021-01-28 MED ORDER — DICLOFENAC SODIUM 75 MG PO TBEC: 75.0000 mg | DELAYED_RELEASE_TABLET | Freq: Two times a day (BID) | ORAL | 1 refills | Status: DC

## 2021-01-28 NOTE — Patient Instructions (Addendum)
Get x-rays after you leave today - we will call you with results and next steps. Start diclofenac 68m twice a day with food for pain and inflammation - I'd recommend taking this for about 10 days regularly then as needed. I'm concerned your pain is due to facet arthritis worse on the right side with some irritation of a nerve here. Consider formal physical therapy, prednisone dose pack, MRI depending on how you do over the next few weeks and what the x-rays show also. Keep your follow-up appointment with BEustace Moorealso.

## 2021-01-30 ENCOUNTER — Encounter: Payer: Self-pay | Admitting: Family Medicine

## 2021-01-30 NOTE — Progress Notes (Signed)
PCP: Josetta Huddle, MD  Subjective:   HPI: Patient is a 70 y.o. male here for low back pain.  10/4: Frank Larson has a history of tightness in his low back and this started in his 20's after helping a friend move a heavy piano. He has managed to stay active and the back pain is usually not bothersome. Recently he has move to a new home and working in his yard a lot. He picks up stick and bending over causes pain. He also digs holes for plant and this seems to make pain worse. He is a swimmer and bending over to pick goggles up is another activity he noticed cause discomfort. No radiculopathy. Today he is here for further evaluation of this pain.   11/2: Patient reports his low back pain has worsened since last visit. He tried the home exercises and stretches but could barely walk after this. Even easing up on exercises seemed to exacerbate his pain. Pain worse on left low back than right. No radiation into legs. No numbness or tingling. No bowel/bladder dysfunction. Tried ibuprofen, naproxen. Worse with extension.  Past Medical History:  Diagnosis Date   Allergy    Arthritis    Colitis    Diverticulosis    Hyperlipidemia    off medicines currently    Metatarsal fracture    right    Current Outpatient Medications on File Prior to Visit  Medication Sig Dispense Refill   albuterol (VENTOLIN HFA) 108 (90 Base) MCG/ACT inhaler INHALE 1-2 PUFFS BY MOUTH AS NEEDED BEFORE EXERCISE 36 g 2   amLODipine (NORVASC) 2.5 MG tablet take 1 tablet by mouth Once a day 90 tablet 3   aspirin 81 MG EC tablet Take 81 mg by mouth daily.     atorvastatin (LIPITOR) 20 MG tablet Take 20 mg by mouth daily.     atorvastatin (LIPITOR) 20 MG tablet TAKE 1 TABLET BY MOUTH ONCE A DAY 90 tablet 3   atorvastatin (LIPITOR) 20 MG tablet TAKE 1 TABLET BY MOUTH ONCE A DAY 90 tablet 3   Cholecalciferol (VITAMIN D3) 50 MCG (2000 UT) capsule Take by mouth.     cyanocobalamin (,VITAMIN B-12,) 1000 MCG/ML injection INJECT  1ML ONCE MONTHLY 10 mL 0   cyanocobalamin (,VITAMIN B-12,) 1000 MCG/ML injection INJECT 1 ML ONCE MONTHLY 6 mL 5   diphenoxylate-atropine (LOMOTIL) 2.5-0.025 MG tablet TAKE 1 TABLET BY MOUTH FOUR TIMES DAILY AS NEEDED FOR DIARRHEA OR LOOSE STOOLS 360 tablet 1   diphenoxylate-atropine (LOMOTIL) 2.5-0.025 MG tablet TAKE 2 TABLETS BY MOUTH 3 TIMES DAILY AS NEEDED 540 tablet 1   diphenoxylate-atropine (LOMOTIL) 2.5-0.025 MG tablet take 2 tablets by mouth three times a day as needed 540 tablet 1   fluticasone (FLONASE) 50 MCG/ACT nasal spray Place 2 sprays into both nostrils daily. 48 g 3   fluticasone (FLONASE) 50 MCG/ACT nasal spray PLACE 1-2 SPRAYS IN EACH NOSTRIL ONCE A DAY AS NEEDED 16 g 5   hydrochlorothiazide (MICROZIDE) 12.5 MG capsule TAKE 1 CAPSULE BY MOUTH UPON WAKING ONCE DAILY 90 capsule 3   metoprolol tartrate (LOPRESSOR) 50 MG tablet Take one tablet by mouth 2 hours prior to your CT 1 tablet 0   nitroGLYCERIN (NITROSTAT) 0.4 MG SL tablet Place 1 tablet under the tongue every 5 (five) minutes x 3 doses as needed.     sildenafil (REVATIO) 20 MG tablet Take 2-5 tablets by mouth once a day if needed 30 tablet 5   valsartan (DIOVAN) 80 MG tablet Take 80  mg by mouth daily.     valsartan (DIOVAN) 80 MG tablet TAKE 1 TABLET BY MOUTH ONCE A DAY 30 tablet 12   zolpidem (AMBIEN) 10 MG tablet TAKE 1 TABLET BY MOUTH AT BEDTIME IF NEEDED 30 tablet 2   zolpidem (AMBIEN) 10 MG tablet TAKE 1 TABLET BY MOUTH EVERY EVENING AT BEDTIME AS NEEDED 30 tablet 2   Zoster Vaccine Adjuvanted [facility]) injection Inject into the muscle to be administered  now and 2nd dose 2-6 months later 1 each 1   No current facility-administered medications on file prior to visit.    Past Surgical History:  Procedure Laterality Date   COLONOSCOPY  08/07/2002   INGUINAL HERNIA REPAIR     NASAL SINUS SURGERY     NOSE SURGERY     broken nose    No Known Allergies  BP 132/70   Ht 6' 1"  (1.854 m)   Wt 145 lb (65.8 kg)    BMI 19.13 kg/m   [facility] 12/30/2020 01/28/2021  Frequency of aerobic exercise (# of days/week) 6 6  Average time in minutes 45 45  Frequency of strengthening activities (# of days/week) 2 2    No flowsheet data found.      Objective:  Physical Exam:  Gen: NAD, comfortable in exam room  Back: No gross deformity, scoliosis. No paraspinal TTP.  No midline or bony TTP. FROM with pain on extension. Strength LEs 5/5 all muscle groups.   2+ MSRs in patellar tendons, equal bilaterally.  1+ right achilles, 2+ left achilles MSR. Negative SLRs. Sensation intact to light touch bilaterally.   Assessment & Plan:  1. Low back pain - suspect his pain is primarily due to facet arthropathy given worse with extension.  Will obtain x-rays to further assess.  Start diclofenac 54m twice a day.  Consider formal physical therapy with flexion based rehab.  Avoid extension activities.  Consider prednisone dose pack, MRI if not improving with the above measures.  Keep follow up appointment with Dr. FOneida Alarlater this month.

## 2021-02-03 ENCOUNTER — Telehealth: Payer: Self-pay | Admitting: Family Medicine

## 2021-02-03 MED ORDER — PREDNISONE 10 MG PO TABS
ORAL_TABLET | ORAL | 0 refills | Status: DC
Start: 2021-02-03 — End: 2021-05-28

## 2021-02-03 NOTE — Telephone Encounter (Signed)
-----   Message from [facility], LAT sent at 02/03/2021  2:56 PM EST ----- Regarding: RE: question Contact: [phone] He would like to try the prednisone dose pack. Can you send it to the pharmacy in [place] listed below? Thanks!  ----- Message ----- From: Dene Gentry, MD Sent: 02/03/2021  10:04 AM EST To: Jolinda Croak, LAT Subject: RE: question                                   He was initially resistant to trying this but prednisone dose pack was another option we discussed.  Other medicines like naproxen, meloxicam are an option but since they're in the same class as diclofenac he may still get side effects.  He could try one of those instead though if he would like first.  ----- Message ----- From: Jolinda Croak, LAT Sent: 02/03/2021   9:38 AM EST To: Dene Gentry, MD Subject: FW: question                                    ----- Message ----- From: Carolyne Littles Sent: 02/03/2021   8:15 AM EST To: Jolinda Croak, LAT Subject: question                                       Pt called stating he is unable to take the Voltaren due to side effects, diarrhea, bloated,dizzy. He thinks it is interacting with his BP med. Pt is  Asking if there is a different medication. He is working out town and is asking to send med to this pharmacy. [facility] [address] phone [phone]

## 2021-02-03 NOTE — Telephone Encounter (Signed)
Ok sent the prednisone dose pack in for him.

## 2021-02-17 ENCOUNTER — Ambulatory Visit (INDEPENDENT_AMBULATORY_CARE_PROVIDER_SITE_OTHER): Payer: Medicare Other | Admitting: Sports Medicine

## 2021-02-17 DIAGNOSIS — M545 Low back pain, unspecified: Secondary | ICD-10-CM | POA: Diagnosis present

## 2021-02-17 DIAGNOSIS — G8929 Other chronic pain: Secondary | ICD-10-CM

## 2021-02-17 NOTE — Assessment & Plan Note (Signed)
I suggested home exercises again but done at a lower level He will avoid any that give him back pain Continue his exercise regimen I recommended a consult with Leatrice Jewels, PT and possibly getting involved in Pilates He can use intermittent Aleve but did not recommend any regular medications Recheck with me as needed

## 2021-02-17 NOTE — Progress Notes (Signed)
Follow-up low back pain  Had to move twice in a short period of time This involved a lot of lifting boxes and digging up plants Seen by me on october 4 and I gave him home exercises He was very aggressive with these doing them twice a day at least and actually got worse symptoms Saw Dr. Barbaraann Barthel on 11 2 and given Voltaren which helped He limited the exercises and continued swimming and started to improve He has a type of chronic lymphocytic colitis and after 5 to 6 days of the Voltaren felt bad and cannot continue taking this He wondered about some interaction with his valsartan  Comes back today for advice Back primarily hurts first thing in the morning when it stiff If he sits for a while and gets up the back will be stiff He has avoided lifting since seeing Dr. Barbaraann Barthel and this helps He is swimming 3 to 4 days a week and then on other days does the stationary bike and the elliptical None of these are bothering his back at present  We reviewed his x-rays tfrom Nov. And they show significant degenerative disc disease primarily at L5-S1 some at L4-5 There is some acquired lumbar scoliosis  Review of systems No sciatica No cough or sneeze pain No weakness  Physical exam thin white male in no acute distress BP (!) 148/82   Ht 6' 1"  (1.854 m)   Wt 145 lb (65.8 kg)   BMI 19.13 kg/m  [facility] Adult Exercise 12/30/2020 01/28/2021  Frequency of aerobic exercise (# of days/week) 6 6  Average time in minutes 45 45  Frequency of strengthening activities (# of days/week) 2 2   He has full range of motion of his back today No pain on normal activities Gait is normal He can demonstrate a pelvic tilt He can demonstrate crunches He can demonstrate back extension None of these caused him any pain

## 2021-02-23 ENCOUNTER — Other Ambulatory Visit (HOSPITAL_COMMUNITY): Payer: Self-pay

## 2021-02-23 MED ORDER — ZOLPIDEM TARTRATE 10 MG PO TABS
ORAL_TABLET | ORAL | 3 refills | Status: DC
  Filled 2021-02-23: qty 30, 30d supply, fill #0

## 2021-02-26 ENCOUNTER — Other Ambulatory Visit (HOSPITAL_COMMUNITY): Payer: Self-pay

## 2021-05-05 ENCOUNTER — Other Ambulatory Visit: Payer: Self-pay | Admitting: Internal Medicine

## 2021-05-05 ENCOUNTER — Other Ambulatory Visit: Payer: Medicare Other

## 2021-05-05 DIAGNOSIS — M79661 Pain in right lower leg: Secondary | ICD-10-CM

## 2021-05-05 DIAGNOSIS — R197 Diarrhea, unspecified: Secondary | ICD-10-CM | POA: Diagnosis not present

## 2021-05-05 DIAGNOSIS — M79669 Pain in unspecified lower leg: Secondary | ICD-10-CM | POA: Diagnosis not present

## 2021-05-08 ENCOUNTER — Other Ambulatory Visit: Payer: BLUE CROSS/BLUE SHIELD

## 2021-05-12 ENCOUNTER — Ambulatory Visit
Admission: RE | Admit: 2021-05-12 | Discharge: 2021-05-12 | Disposition: A | Discharge status: Home / Self Care | Payer: Medicare Other | Source: Ambulatory Visit | Attending: Internal Medicine | Admitting: Internal Medicine

## 2021-05-12 DIAGNOSIS — M79661 Pain in right lower leg: Secondary | ICD-10-CM | POA: Diagnosis not present

## 2021-05-28 ENCOUNTER — Encounter: Payer: Self-pay | Admitting: Internal Medicine

## 2021-05-28 ENCOUNTER — Ambulatory Visit: Payer: Medicare Other | Admitting: Internal Medicine

## 2021-05-28 ENCOUNTER — Telehealth: Payer: Self-pay | Admitting: Internal Medicine

## 2021-05-28 VITALS — BP 130/60 | HR 54 | Ht 73.0 in | Wt 144.0 lb

## 2021-05-28 DIAGNOSIS — R159 Full incontinence of feces: Secondary | ICD-10-CM | POA: Diagnosis not present

## 2021-05-28 DIAGNOSIS — R197 Diarrhea, unspecified: Secondary | ICD-10-CM

## 2021-05-28 DIAGNOSIS — K52832 Lymphocytic colitis: Secondary | ICD-10-CM

## 2021-05-28 NOTE — Telephone Encounter (Signed)
Patient stated that he called [facility] to get an appointment with Percell Miller Barns and they stated they need a refferal from Dr. Henrene Pastor. Please advise.  ?

## 2021-05-28 NOTE — Progress Notes (Signed)
HISTORY OF PRESENT ILLNESS: ? ?Frank Larson is a 71 y.o. male with a history of lymphocytic colitis diagnosed on several occasions with colonoscopy and biopsies.  Most recent colonoscopy with biopsies January 2020.  His symptoms were refractory to budesonide and antidiarrheals.  He was subsequently started on Humira.  This was effective for a period of time.  However, symptoms recurred.  Drug testing revealed that the patient had developed significant antibodies to adalimumab with negligible drug levels.  This was stopped.  He was last seen May 31, 2019.  The plan at that time was to initiate Entyvio and reinitiate budesonide.  He was to follow-up in 3 months, but did not.  Patient tells me that he did not start budesonide concerned about side effects.  He tells me that he did not initiate Entyvio due to cost.  Since that time he has been treating himself with Lomotil 1 twice daily.  He states that he is doing "okay" on this but not as well as he would like.  He reports 6-8 bowel movements per day which are not formed.  Things are most problematic when he is out at a restaurant, traveling, or walking his dog in the morning.  There could be significant urgency.  He has had incontinence in the past.  His weight has been stable.  He has been seen in the past by [facility].  They suggested off label therapy, of which she is not certain the type.  It had been recommended in the past as he reestablish for other treatment options, but he did not.  He is getting his antidiarrheal from his PCP.  He tells me that he did have a DVT in his right lower extremity while traveling ? ?REVIEW OF SYSTEMS: ? ?All non-GI ROS negative unless otherwise stated in the HPI. ?Past Medical History:  ?Diagnosis Date  ? Allergy   ? Arthritis   ? Colitis   ? Diverticulosis   ? Hyperlipidemia   ? off medicines currently   ? Metatarsal fracture   ? right  ? ? ?Past Surgical History:  ?Procedure Laterality Date  ? COLONOSCOPY  08/07/2002  ? INGUINAL HERNIA  REPAIR    ? NASAL SINUS SURGERY    ? NOSE SURGERY    ? broken nose  ? ? ?Social History ?Adonis Huguenin  reports that he has never smoked. He has never used smokeless tobacco. He reports current alcohol use of about 4.0 standard drinks per week. He reports that he does not use drugs. ? ?family history includes Colitis in his father; Heart disease in his father; Hyperlipidemia in his father; Parkinson's disease in his mother; Peptic Ulcer Disease in his father and mother. ? ?No Known Allergies ? ?  ? ?PHYSICAL EXAMINATION: ?Vital signs: BP 130/60   Pulse (!) 54   Ht 6' 1"  (1.854 m)   Wt 144 lb (65.3 kg)   SpO2 96%   BMI 19.00 kg/m?   ?Constitutional: generally well-appearing, no acute distress ?Psychiatric: alert and oriented x3, cooperative ?Eyes: extraocular movements intact, anicteric, conjunctiva pink ?Mouth: oral pharynx moist, no lesions ?Neck: supple no lymphadenopathy ?Cardiovascular: heart regular rate and rhythm, no murmur ?Lungs: clear to auscultation bilaterally ?Abdomen: soft, nontender, nondistended, no obvious ascites, no peritoneal signs, normal bowel sounds, no organomegaly ?Rectal: Omitted ?Extremities: no clubbing, cyanosis, or lower extremity edema bilaterally ?Skin: no lesions on visible extremities ?Neuro: No focal deficits.  Cranial nerves intact ? ?ASSESSMENT: ? ?1.  Lymphocytic colitis.  Ongoing symptoms.  Currently being managed with twice daily Lomotil.  Previously failed budesonide and antidiarrheals.  Failed Humira.  Recommended trial of Entyvio but he did not due to prohibitive cost.  Has been seen previously at IBD clinic in [facility]. ?2.  History of DVT while traveling ? ?PLAN: ? ?1.  Recommend Citrucel 2 tablespoons daily as a nonspecific bulking agent ?2.  Continue antidiarrheal ?3.  Recommended that he reestablish with the [facility] IBD clinic to see if there are other treatment options for his lymphocytic colitis. ?4.  Follow-up in this clinic as needed ?A total time of 30 minutes  was spent preparing to see the patient, obtaining comprehensive history, performing medically appropriate physical exam, counseling and educating the patient regarding his lymphocytic colitis, recommending fiber and reestablishment with tertiary care center, and documenting clinical information in the health record ? ? ? ?  ?

## 2021-05-28 NOTE — Patient Instructions (Signed)
If you are age 71 or older, your body mass index should be between 23-30. Your Body mass index is 19 kg/m?Marland Kitchen If this is out of the aforementioned range listed, please consider follow up with your Primary Care Provider. ? ?If you are age 24 or younger, your body mass index should be between 19-25. Your Body mass index is 19 kg/m?Marland Kitchen If this is out of the aformentioned range listed, please consider follow up with your Primary Care Provider.  ? ?________________________________________________________ ? ?The [place] [facility] providers would like to encourage you to use [facility] to communicate with providers for non-urgent requests or questions.  Due to long hold times on the telephone, sending your provider a message by [facility] may be a faster and more efficient way to get a response.  Please allow 48 business hours for a response.  Please remember that this is for non-urgent requests.  ?_______________________________________________________ ? ?Take 2 tablespoons of Citrucel daily ?

## 2021-06-01 NOTE — Telephone Encounter (Signed)
Completed referral form and faxed along with supporting documentation to [facility].  Lm on vm that I had done so ?

## 2021-07-07 DIAGNOSIS — H40023 Open angle with borderline findings, high risk, bilateral: Secondary | ICD-10-CM | POA: Diagnosis not present

## 2021-07-07 DIAGNOSIS — H43812 Vitreous degeneration, left eye: Secondary | ICD-10-CM | POA: Diagnosis not present

## 2021-07-07 DIAGNOSIS — H31091 Other chorioretinal scars, right eye: Secondary | ICD-10-CM | POA: Diagnosis not present

## 2021-07-07 DIAGNOSIS — H2513 Age-related nuclear cataract, bilateral: Secondary | ICD-10-CM | POA: Diagnosis not present

## 2021-07-29 ENCOUNTER — Encounter: Payer: Self-pay | Admitting: Dermatology

## 2021-07-29 ENCOUNTER — Ambulatory Visit: Payer: Medicare Other | Admitting: Dermatology

## 2021-07-29 DIAGNOSIS — Z85858 Personal history of malignant neoplasm of other endocrine glands: Secondary | ICD-10-CM

## 2021-07-29 DIAGNOSIS — Z1283 Encounter for screening for malignant neoplasm of skin: Secondary | ICD-10-CM

## 2021-07-29 DIAGNOSIS — D487 Neoplasm of uncertain behavior of other specified sites: Secondary | ICD-10-CM | POA: Diagnosis not present

## 2021-07-29 DIAGNOSIS — D485 Neoplasm of uncertain behavior of skin: Secondary | ICD-10-CM | POA: Diagnosis not present

## 2021-07-29 DIAGNOSIS — L820 Inflamed seborrheic keratosis: Secondary | ICD-10-CM | POA: Diagnosis not present

## 2021-07-29 DIAGNOSIS — L570 Actinic keratosis: Secondary | ICD-10-CM | POA: Diagnosis not present

## 2021-07-29 NOTE — Patient Instructions (Signed)

## 2021-08-03 ENCOUNTER — Telehealth: Payer: Self-pay

## 2021-08-03 NOTE — Telephone Encounter (Signed)
-----   Message from Lavonna Monarch, MD sent at 08/01/2021  7:08 AM EDT ----- ?The pathologist could not rule out the left cheek being a nonmelanoma skin cancer.  Schedule as a routine visit in 5 to 6 months. ?

## 2021-08-03 NOTE — Telephone Encounter (Signed)
Phone call from patient returning our call. Patient aware of pathology results and Dr. Onalee Hua recommendations. Patient states that he doesn't have his schedule with him at this moment so he will give [place] a call back.  ?

## 2021-08-03 NOTE — Telephone Encounter (Signed)
Phone call to patient with his pathology results. Voicemail left for patient to give the office a call back.  ?

## 2021-08-15 ENCOUNTER — Encounter: Payer: Self-pay | Admitting: Dermatology

## 2021-08-15 NOTE — Progress Notes (Signed)
   New Patient   Subjective  Frank Larson is a 71 y.o. male who presents for the following: Annual Exam (Yearly check and on lesion on the face itch and bleed near a old biopsy per patient. H/o of bcc on the left cheek 2018).  General skin examination, new crust near site of skin cancer on face Location:  Duration:  Quality:  Associated Signs/Symptoms: Modifying Factors:  Severity:  Timing: Context:    The following portions of the chart were reviewed this encounter and updated as appropriate:  Tobacco  Allergies  Meds  Problems  Med Hx  Surg Hx  Fam Hx      Objective  Well appearing patient in no apparent distress; mood and affect are within normal limits. Waist up skin examination: 2 possible facial skin cancers and 1 atypical pigmented spot lower back will be biopsied.  Right Temple Waxy pink 5 mm crust     left cheek PREVIOUS BIOPSY CX3 F5U AND EXC BCC.  Focally eroded 4 mm pink crusts at superior margin of hypopigmented scar     Right Lower Back 2 toned 4 mm dark gray-brown macule       All skin waist up examined.   Assessment & Plan  Screening exam for skin cancer  Annual skin examination.  Neoplasm of uncertain behavior of skin (3) Right Temple  Skin / nail biopsy Type of biopsy: tangential   Informed consent: discussed and consent obtained   Timeout: patient name, date of birth, surgical site, and procedure verified   Anesthesia: the lesion was anesthetized in a standard fashion   Anesthetic:  1% lidocaine w/ epinephrine 1-100,000 local infiltration Instrument used: flexible razor blade   Hemostasis achieved with: aluminum chloride and electrodesiccation   Outcome: patient tolerated procedure well   Post-procedure details: wound care instructions given    Specimen 1 - Surgical pathology Differential Diagnosis: bcc scc  Check Margins: No  left cheek  Skin / nail biopsy Type of biopsy: tangential   Informed consent: discussed  and consent obtained   Timeout: patient name, date of birth, surgical site, and procedure verified   Anesthesia: the lesion was anesthetized in a standard fashion   Anesthetic:  1% lidocaine w/ epinephrine 1-100,000 local infiltration Instrument used: flexible razor blade   Hemostasis achieved with: aluminum chloride and electrodesiccation   Outcome: patient tolerated procedure well   Post-procedure details: wound care instructions given    Specimen 2 - Surgical pathology Differential Diagnosis: bcc scc [id number]  Check Margins: No  Right Lower Back  Skin / nail biopsy Type of biopsy: tangential   Informed consent: discussed and consent obtained   Timeout: patient name, date of birth, surgical site, and procedure verified   Anesthesia: the lesion was anesthetized in a standard fashion   Anesthetic:  1% lidocaine w/ epinephrine 1-100,000 local infiltration Instrument used: flexible razor blade   Hemostasis achieved with: aluminum chloride and electrodesiccation   Outcome: patient tolerated procedure well   Post-procedure details: wound care instructions given    Specimen 3 - Surgical pathology Differential Diagnosis: bcc scc  Check Margins: No

## 2021-08-17 DIAGNOSIS — E538 Deficiency of other specified B group vitamins: Secondary | ICD-10-CM | POA: Diagnosis not present

## 2021-08-17 DIAGNOSIS — G4700 Insomnia, unspecified: Secondary | ICD-10-CM | POA: Diagnosis not present

## 2021-08-17 DIAGNOSIS — E559 Vitamin D deficiency, unspecified: Secondary | ICD-10-CM | POA: Diagnosis not present

## 2021-08-17 DIAGNOSIS — Z0001 Encounter for general adult medical examination with abnormal findings: Secondary | ICD-10-CM | POA: Diagnosis not present

## 2021-08-17 DIAGNOSIS — E785 Hyperlipidemia, unspecified: Secondary | ICD-10-CM | POA: Diagnosis not present

## 2021-08-17 DIAGNOSIS — J45990 Exercise induced bronchospasm: Secondary | ICD-10-CM | POA: Diagnosis not present

## 2021-08-17 DIAGNOSIS — I2510 Atherosclerotic heart disease of native coronary artery without angina pectoris: Secondary | ICD-10-CM | POA: Diagnosis not present

## 2021-08-17 DIAGNOSIS — N281 Cyst of kidney, acquired: Secondary | ICD-10-CM | POA: Diagnosis not present

## 2021-08-17 DIAGNOSIS — K5289 Other specified noninfective gastroenteritis and colitis: Secondary | ICD-10-CM | POA: Diagnosis not present

## 2021-08-17 DIAGNOSIS — R030 Elevated blood-pressure reading, without diagnosis of hypertension: Secondary | ICD-10-CM | POA: Diagnosis not present

## 2021-11-11 DIAGNOSIS — H905 Unspecified sensorineural hearing loss: Secondary | ICD-10-CM | POA: Diagnosis not present

## 2021-12-16 DIAGNOSIS — R0609 Other forms of dyspnea: Secondary | ICD-10-CM | POA: Diagnosis not present

## 2021-12-16 DIAGNOSIS — U099 Post covid-19 condition, unspecified: Secondary | ICD-10-CM | POA: Diagnosis not present

## 2021-12-16 DIAGNOSIS — J45990 Exercise induced bronchospasm: Secondary | ICD-10-CM | POA: Diagnosis not present

## 2021-12-16 DIAGNOSIS — R5383 Other fatigue: Secondary | ICD-10-CM | POA: Diagnosis not present

## 2022-01-20 DIAGNOSIS — M25532 Pain in left wrist: Secondary | ICD-10-CM | POA: Diagnosis not present

## 2022-01-20 DIAGNOSIS — M25531 Pain in right wrist: Secondary | ICD-10-CM | POA: Diagnosis not present

## 2022-01-21 DIAGNOSIS — H905 Unspecified sensorineural hearing loss: Secondary | ICD-10-CM | POA: Diagnosis not present

## 2022-02-09 ENCOUNTER — Ambulatory Visit: Payer: Medicare Other | Admitting: Sports Medicine

## 2022-02-09 VITALS — BP 120/68 | Ht 72.0 in | Wt 148.0 lb

## 2022-02-09 DIAGNOSIS — M25551 Pain in right hip: Secondary | ICD-10-CM

## 2022-02-09 NOTE — Progress Notes (Signed)
Established Patient Office Visit  Subjective   Patient ID: Frank Larson, male    DOB: March 03, 1951  Age: 71 y.o. MRN: [id number]  Hip pain.  He presents today with chief complaint of right hip pain, bilateral pain at the base of his thumbs and numbness in his right foot with certain activities.  He reports the most bothersome today is his right hip.  He says this has been bothering him for multiple years but has been worsening over the past year.  He came in for this as he thought this could be related to the numbness in his foot.  His hip pain is located on the lateral side of his leg does not radiate down his leg but it is worse when he is driving, kayaking or laying on that side to go to sleep.  This pain does not wake him from sleep.  He denies any groin pain, weakness, loss of bowel and bladder control or any radiation of his hip pain down his leg.  He reports the right foot numbness is on the outside of his foot and occurs most often when he is doing hip bridges or swimming.  Of note he reports a past medical history of DJD L4-L5, L5-S1.  He is also been experiencing bilateral pain at the base of his thumbs left greater than right.  Of note he is right-handed.  He reports that his pain is a dull ache sometimes there after he does a lot of things such as opening jars but also some days it is there at rest.  He has tried some topical Voltaren with minimal relief.   ROS as listed above in HPI    Objective:     BP 120/68   Ht 6' (1.829 m)   Wt 148 lb (67.1 kg)   BMI 20.07 kg/m   Physical Exam Vitals reviewed.  Constitutional:      General: He is not in acute distress.    Appearance: Normal appearance. He is normal weight. He is not ill-appearing, toxic-appearing or diaphoretic.  Pulmonary:     Effort: Pulmonary effort is normal.  Neurological:     Mental Status: He is alert.   Right hip: No obvious deformity or asymmetry.  Tenderness to palpation over the greater trochanteric and  along iliotibial band. FROM flexion, internal and external rotation. Strength 5/5 resisted hip flexion and resisted abduction. Negative log roll test. Normal gait.   TTP b/l CMC joints L>R, FROM abduction and circumduction, distal pulses, radial 2+  Right foot: no obvious deformity, FROM ankle, strength 5/5 ankle dorsi and plantar flexion. Dorsalis pedis 2+   Limited US b/l CMC joints: R CMC bilateral bone spurs, preserved joint space L CMC bilateral bone spurs, decreased joint space seen decreased more on dorsal side    Assessment & Plan:   Problem List Items Addressed This Visit       Other   Pain of right hip - Primary  Right hip greater trochanteric bursitis, reviewed exercises and gave him a handout with them described for him to perform regularly until his pain improves.  He has no clinical signs of hip osteoarthritis today.  We discussed steroid injection however he declined today as he states his pain is not that bad.  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  There is clinical and radiographical evidence of osteoarthritis.  Recommend topical anti-inflammatories such as Voltaren.  We did discuss steroid injection today however he reports his pain is not that  bad at this time.  He will follow-up as needed.  Right foot numbness.  I believe this is likely secondary to his secondary to his back DDD L5-S1.  Encouraged him to decrease activities that involve back extension.  He verbalized understanding.  He may continue with the rest of his back therapy exercises.  Return if symptoms worsen or fail to improve.    Elmore Guise, DO  I observed and examined the patient with the [facility] resident and agree with assessment and plan.  Note reviewed and modified by me. Ila Mcgill, MD

## 2022-03-03 DIAGNOSIS — K52832 Lymphocytic colitis: Secondary | ICD-10-CM | POA: Diagnosis not present

## 2022-03-03 DIAGNOSIS — K529 Noninfective gastroenteritis and colitis, unspecified: Secondary | ICD-10-CM | POA: Diagnosis not present

## 2022-04-06 ENCOUNTER — Ambulatory Visit: Payer: Medicare Other | Admitting: Sports Medicine

## 2022-04-06 VITALS — BP 124/80 | Ht 72.5 in | Wt 144.0 lb

## 2022-04-06 DIAGNOSIS — M5386 Other specified dorsopathies, lumbar region: Secondary | ICD-10-CM | POA: Diagnosis not present

## 2022-04-06 MED ORDER — GABAPENTIN 300 MG PO CAPS: 300.0000 mg | ORAL_CAPSULE | Freq: Every day | ORAL | 2 refills | Status: DC

## 2022-04-06 NOTE — Assessment & Plan Note (Signed)
I think his sciatica is been triggered by his low back and not his piriformis Sitting does make this worse but I think it is because he is a hyper irritable This is starting to bother him with sleep and is more common  We will plan a trial of gabapentin 300 at night Keep up his hip exercises Keep up with other exercises including swimming and elliptical which do not bother him Stationary bike does seem to trigger symptoms so I think he should avoid that Recheck him in a month

## 2022-04-06 NOTE — Progress Notes (Signed)
Chief complaint right hip and leg numbness  Patient was seen in November 2023 with some pain in his right hip He had some weakness and did home exercises The pain pretty much resolved but he is left with some numbness over his buttocks and that radiates down the back and lateral side of his leg all the way to his foot Sitting in a chair will bring on more of the numbness Sitting in a kayak makes this worse Using a pillow under his buttocks when he sits makes it less He does not have weakness  He has some known lumbar degenerative disc disease at L5-S1 from an x-ray we did last year  Physical exam Thin but athletic white male in no acute distress BP 124/80   Ht 6' 0.5" (1.842 m)   Wt 144 lb (65.3 kg)   BMI 19.26 kg/m   Right hip range of motion is normal Straight leg raise refers some pain to the right side of his low back Point tenderness over the piriformis muscle but this does not recreate the sciatica Strength testing of the hip today was normal with strong abduction Other hip testing and piriformis stretches do not worsen the pain but refer a bit of pain to his low back No significant weakness detected

## 2022-04-21 ENCOUNTER — Ambulatory Visit: Payer: Medicare Other | Admitting: Sports Medicine

## 2022-04-21 VITALS — BP 138/72 | Ht 72.5 in | Wt 144.0 lb

## 2022-04-21 DIAGNOSIS — M5386 Other specified dorsopathies, lumbar region: Secondary | ICD-10-CM | POA: Diagnosis not present

## 2022-04-21 NOTE — Assessment & Plan Note (Signed)
Worsening of the numbness and tingling on the lateral side of his right foot, patient has had no side effects from 300 mg at night gabapentin, I recommend he increase his gabapentin to 600 mg at night.  He is also going to discontinue his hip bridges as these exacerbate his symptoms and be more aware when swimming to remove any back extension.  We discussed further imaging however at this time patient would like to hold off and see if he gets any better with more conservative measures.  Will follow-up with him 1 to 2 weeks. In the setting of his bilateral feet cramping patient has had more diarrhea than usual.  Recommend he trial some gentle electrolyte supplementation with once every other day Gatorade or Pedialyte as your sugar.  He verbalized understanding.  If this persists recommend further workup with iron studies and electrolytes.  He verbalized understanding.

## 2022-04-21 NOTE — Patient Instructions (Signed)
I do feel that this is still likely problem stemming from your back.  Increase your gabapentin to 600 mg at night.  Remove any back extension exercises from your routine.  Make sure you are staying hydrated as you are having increased diarrhea recommend some gentle electrolyte replacement with Gatorade or Pedialyte 0 sugar every other day.  We will follow-up with you in 1 to 2 weeks.  Call the office if you have any questions or concerns before then.

## 2022-04-21 NOTE — Progress Notes (Unsigned)
Established Patient Office Visit  Subjective   Patient ID: Frank Larson, male    DOB: Nov 25, 1950  Age: 72 y.o. MRN: [id number]  Right foot numbness.  Frank Larson is here today for follow-up of his right foot numbness and tingling.  He was evaluated by Dr. Oneida Alar on 04/06/2022 and discussed that his radiating numbness and tingling down his right foot is likely secondary to sciatica versus radiculopathy.  He was started on 300 mg of gabapentin at night and given some hip back exercises.  He has been using the gabapentin and exercising regularly with no improvement of his numbness tingling or discomfort.  In fact he reports for the past week or so his numbness has become constant located on the outside of his right foot.  The only change he noted recently was an increase in his colestipol medication by his GI doctor.  He also reports some cramping in bilateral feet at night this is occurred 3 nights now.  In light of his numbness and tingling he denies any new back pain.  His numbness and tingling is worse with he is doing the backstroke or hip bridges.  Nothing else seems to exacerbate his symptoms of numbness and tingling.  He denies any new back pain, loss of bowel or bladder dysfunction or weakness.   ROS as listed above in HPI    Objective:     BP 138/72   Ht 6' 0.5" (1.842 m)   Wt 144 lb (65.3 kg)   BMI 19.26 kg/m   Physical Exam Vitals reviewed.  Constitutional:      General: He is not in acute distress.    Appearance: Normal appearance. He is normal weight. He is not ill-appearing, toxic-appearing or diaphoretic.  Pulmonary:     Effort: Pulmonary effort is normal.  Neurological:     Mental Status: He is alert.   Right foot: No obvious deformity or asymmetry.  No ecchymosis or edema.  No tenderness to palpation.  Decreased sensation to light touch in the lateral toes and up to just above the lateral malleolus.  Full range of motion at the ankle flexion and extension inversion  and eversion.  Strength 5/5 with resisted plantarflexion and dorsiflexion.  Dorsalis pedis 2+.  Capillary refill sluggish, greater than 2 seconds.  Soft nontender calf.  Normal gait Low back: No obvious deformity or asymmetry.  No tenderness to palpation midline or paraspinal muscles.  No tenderness to palpation at the SI joint.  Good range of motion with flexion and extension.  Negative seated straight leg raise on the right.    Assessment & Plan:   Problem List Items Addressed This Visit       Nervous and Auditory   Sciatica of right side associated with disorder of lumbar spine - Primary    Worsening of the numbness and tingling on the lateral side of his right foot, patient has had no side effects from 300 mg at night gabapentin, I recommend he increase his gabapentin to 600 mg at night.  He is also going to discontinue his hip bridges as these exacerbate his symptoms and be more aware when swimming to remove any back extension.  We discussed further imaging however at this time patient would like to hold off and see if he gets any better with more conservative measures.  Will follow-up with him 1 to 2 weeks. In the setting of his bilateral feet cramping patient has had more diarrhea than usual.  Recommend he trial  some gentle electrolyte supplementation with once every other day Gatorade or Pedialyte as your sugar.  He verbalized understanding.  If this persists recommend further workup with iron studies and electrolytes.  He verbalized understanding.       Return in about 2 weeks (around 05/05/2022).    Frank Guise, DO

## 2022-05-06 ENCOUNTER — Ambulatory Visit: Payer: Medicare Other | Admitting: Sports Medicine

## 2022-05-06 VITALS — BP 138/70 | Ht 73.0 in | Wt 145.0 lb

## 2022-05-06 DIAGNOSIS — M5386 Other specified dorsopathies, lumbar region: Secondary | ICD-10-CM

## 2022-05-06 MED ORDER — GABAPENTIN 300 MG PO CAPS: ORAL_CAPSULE | ORAL | 2 refills | Status: DC

## 2022-05-06 NOTE — Assessment & Plan Note (Signed)
I think we need to aggressively push Frank Larson to see if he will get a response 300/300/ 600 600/300/600 600 Frank Larson Then recheck to see if we are getting a response  If no response we need to consider MRI  He can continue activity that does not worsen sxs  Reck 3 to 4 weeks

## 2022-05-06 NOTE — Patient Instructions (Addendum)
We are going to increase your gabapentin over the next few weeks. Please do this as follows:  300 mg am - 300 mg midday - 600 mg at night for 1 week 600 mg am - 300 mg midday - 600 mg at night for 1 week 600 mg am - 600 mg midday - 600 mg at night and stay at this dose.  Once you have been taking the gabapentin 600 mg 3 times a day for 1 week, please see [place] back for follow up  [facility] refilled your medication to reflect this increase.  Call with any questions

## 2022-05-06 NOTE — Progress Notes (Addendum)
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  Patient is actually starting to feel less numbness and no significant pain.  Able to do walking, motion exercises and some swimming. No sideffects with GB. We will gradually progress his activity.  Add some flexion stretches and pelvic tilts.  Continue GB and check in 6 weeks.  Ila Mcgill, MD

## 2022-05-25 ENCOUNTER — Other Ambulatory Visit: Payer: Self-pay | Admitting: *Deleted

## 2022-05-25 DIAGNOSIS — M5416 Radiculopathy, lumbar region: Secondary | ICD-10-CM

## 2022-05-26 ENCOUNTER — Ambulatory Visit
Admission: RE | Admit: 2022-05-26 | Discharge: 2022-05-26 | Disposition: A | Discharge status: Home / Self Care | Payer: Medicare Other | Source: Ambulatory Visit | Attending: Sports Medicine | Admitting: Sports Medicine

## 2022-05-26 DIAGNOSIS — M545 Low back pain, unspecified: Secondary | ICD-10-CM | POA: Diagnosis not present

## 2022-05-26 DIAGNOSIS — M5416 Radiculopathy, lumbar region: Secondary | ICD-10-CM

## 2022-05-28 DIAGNOSIS — L03116 Cellulitis of left lower limb: Secondary | ICD-10-CM | POA: Diagnosis not present

## 2022-06-04 DIAGNOSIS — L03116 Cellulitis of left lower limb: Secondary | ICD-10-CM | POA: Diagnosis not present

## 2022-06-08 ENCOUNTER — Ambulatory Visit: Payer: Medicare Other | Admitting: Sports Medicine

## 2022-06-08 VITALS — BP 124/76 | Ht 72.5 in | Wt 150.0 lb

## 2022-06-08 DIAGNOSIS — M5386 Other specified dorsopathies, lumbar region: Secondary | ICD-10-CM | POA: Diagnosis not present

## 2022-06-08 NOTE — Assessment & Plan Note (Signed)
Continue with home exercise program and gabapentin.  Would recommend staying on a somewhat higher dose of gabapentin until all symptoms have resolved especially as he is not reporting any adverse effects or side effects.  Continue with activities that do not elicit any worsening of symptoms, leg swelling without kicking.  Follow-up as needed.

## 2022-06-08 NOTE — Progress Notes (Signed)
   Established Patient Office Visit  Subjective   Patient ID: Frank Larson, male    DOB: December 29, 1950  Age: 72 y.o. MRN: [id number]  Follow-up right leg numbness and tingling.  He is here today for follow-up on some right leg numbness and tingling.  Since his last visit he underwent an MRI that showed L5-S1 right-sided disc bulge.  He has been performing daily home exercises and stretches along with increasing his gabapentin.  Since we last saw him he decreased to 6 tablets a day and has been slowly decreasing his dose to four 300 mg tablets a day.  He reports these have helped his symptoms and also help him to sleep at night.  He has been back to the pool a couple of times but without any kicking activities.  He denies any worsening of his symptoms, weakness or loss of bowel or bladder control.   ROS as listed above in HPI    Objective:     BP 124/76   Ht 6' 0.5" (1.842 m)   Wt 150 lb (68 kg)   BMI 20.06 kg/m   Physical Exam Vitals reviewed.  Constitutional:      General: He is not in acute distress.    Appearance: Normal appearance. He is normal weight. He is not ill-appearing or toxic-appearing.  HENT:     Head: Normocephalic.  Pulmonary:     Effort: Pulmonary effort is normal.  Neurological:     Mental Status: He is alert.   Lumbar spine: No obvious deformity or asymmetry.  Good range of motion.  Sensation intact in the lower extremity.  He reports slightly decreased sensation over the lateral right toes as compared to his left leg.  Strength 5/5 resisted knee flexion extension, resisted hip flexion.  Patellar reflex 2+, Achilles reflex unable to elicit on the right.  Normal gait.  Negative seated straight leg raise bilaterally.     Assessment & Plan:   Problem List Items Addressed This Visit       Nervous and Auditory   Sciatica of right side associated with disorder of lumbar spine - Primary    Continue with home exercise program and gabapentin.  Would recommend  staying on a somewhat higher dose of gabapentin until all symptoms have resolved especially as he is not reporting any adverse effects or side effects.  Continue with activities that do not elicit any worsening of symptoms, leg swelling without kicking.  Follow-up as needed.       Return if symptoms worsen or fail to improve.    Elmore Guise, DO  I observed and examined the patient with the [facility] resident and agree with assessment and plan.  Note reviewed and modified by me. Ila Mcgill, MD

## 2022-07-26 ENCOUNTER — Other Ambulatory Visit: Payer: Self-pay | Admitting: Sports Medicine

## 2022-08-04 ENCOUNTER — Other Ambulatory Visit: Payer: Self-pay | Admitting: *Deleted

## 2022-08-04 MED ORDER — GABAPENTIN 300 MG PO CAPS: ORAL_CAPSULE | ORAL | 2 refills | Status: DC

## 2022-08-10 ENCOUNTER — Ambulatory Visit: Payer: Medicare Other | Admitting: Sports Medicine

## 2022-08-10 VITALS — BP 138/74 | Ht 73.0 in | Wt 150.0 lb

## 2022-08-10 DIAGNOSIS — M545 Low back pain, unspecified: Secondary | ICD-10-CM

## 2022-08-10 MED ORDER — CYCLOBENZAPRINE HCL 5 MG PO TABS: ORAL_TABLET | ORAL | 0 refills | Status: AC

## 2022-08-10 MED ORDER — PREDNISONE 20 MG PO TABS: 20.0000 mg | ORAL_TABLET | Freq: Two times a day (BID) | ORAL | 0 refills | Status: DC

## 2022-08-10 NOTE — Assessment & Plan Note (Signed)
Patient is likely suffered a muscular strain after some heavy yard work on Friday.  Recommend he continue with heat, gentle exercises that we provided him today.  I have also sent to his pharmacy Flexeril 5 mg that he can take 1 to 2 tablets a day for the next 1 to 2 weeks while he is in [place].  He was counseled on potential side effects.  We also sent to his pharmacy some prednisone 20 mg twice daily in case he has a flare of his radicular pain while he is on his trip.  We can follow-up with him once he returns from his trip as needed.

## 2022-08-10 NOTE — Progress Notes (Signed)
Established Patient Office Visit  Subjective   Patient ID: Frank Larson, male    DOB: 07-24-1950  Age: 72 y.o. MRN: [id number]  Left-sided low back pain.  [name] is here today with chief complaint of left-sided back pain for the past couple days.  He reports on Friday he was doing some heavy lifting pushing and pulling in his yard when he started to have some left-sided back pain.  He has tried some naproxen with minimal relief.  His pain has slightly decreased over the past couple days however he is concerned as he is going to be on a flight to [place] on Friday.  His pain is worse when he transitions from a seated to standing position.  He denies any radiation down his leg, loss of bowel or bladder control.  He has been working on some low back exercises that he has been given from [facility] and states some of them give him some pain.  He is also inquiring if he is able to swim or if that will make his pain worse.  He has some known degenerative changes and mild foraminal narrowing of his low back seen on an MRI performed earlier this year 04/2022   ROS as listed above in HPI    Objective:     BP 138/74   Ht 6\' 1"  (1.854 m)   Wt 150 lb (68 kg)   BMI 19.79 kg/m   Physical Exam Vitals reviewed.  Constitutional:      General: He is not in acute distress.    Appearance: Normal appearance. He is normal weight. He is not ill-appearing, toxic-appearing or diaphoretic.     Comments: Seated on a cushion in the exam room  Neurological:     Mental Status: He is alert.   Left-sided low back: No obvious deformity or asymmetry.  No ecchymosis edema or rashes.  No tenderness to palpation of the midline spine or step-off.  Tenderness to palpation of the paraspinals on the left and quadratus lumborum.  Full range of motion with flexion and extension, sidebending and rotation.  Negative seated straight leg raise.  Sensation intact to light touch.  Strength 5/5 resisted knee flexion and extension.   Patellar reflex 2/4, equivocal to the right.  Normal gait. Neuro testing normal.    Assessment & Plan:   Problem List Items Addressed This Visit       Other   Low back pain - Primary    Patient is likely suffered a muscular strain after some heavy yard work on Friday.  Recommend he continue with heat, gentle exercises that we provided him today.  I have also sent to his pharmacy Flexeril 5 mg that he can take 1 to 2 tablets a day for the next 1 to 2 weeks while he is in [place].  He was counseled on potential side effects.  We also sent to his pharmacy some prednisone 20 mg twice daily in case he has a flare of his radicular pain while he is on his trip.  We can follow-up with him once he returns from his trip as needed.      Relevant Medications   cyclobenzaprine (FLEXERIL) 5 MG tablet   predniSONE (DELTASONE) 20 MG tablet    Return in about 6 weeks (around 09/21/2022), or if symptoms worsen or fail to improve.    Claudie Leach, DO  I observed and examined the patient with the [facility] resident and agree with assessment and plan.  Note reviewed and modified by me. Sterling Big, MD

## 2022-09-10 DIAGNOSIS — K52832 Lymphocytic colitis: Secondary | ICD-10-CM | POA: Diagnosis not present

## 2022-09-15 DIAGNOSIS — J45990 Exercise induced bronchospasm: Secondary | ICD-10-CM | POA: Diagnosis not present

## 2022-09-15 DIAGNOSIS — E785 Hyperlipidemia, unspecified: Secondary | ICD-10-CM | POA: Diagnosis not present

## 2022-09-15 DIAGNOSIS — E538 Deficiency of other specified B group vitamins: Secondary | ICD-10-CM | POA: Diagnosis not present

## 2022-09-15 DIAGNOSIS — Z0000 Encounter for general adult medical examination without abnormal findings: Secondary | ICD-10-CM | POA: Diagnosis not present

## 2022-09-15 DIAGNOSIS — G4700 Insomnia, unspecified: Secondary | ICD-10-CM | POA: Diagnosis not present

## 2022-09-15 DIAGNOSIS — I2510 Atherosclerotic heart disease of native coronary artery without angina pectoris: Secondary | ICD-10-CM | POA: Diagnosis not present

## 2022-09-15 DIAGNOSIS — K5289 Other specified noninfective gastroenteritis and colitis: Secondary | ICD-10-CM | POA: Diagnosis not present

## 2022-09-15 DIAGNOSIS — Z79899 Other long term (current) drug therapy: Secondary | ICD-10-CM | POA: Diagnosis not present

## 2022-09-15 DIAGNOSIS — E559 Vitamin D deficiency, unspecified: Secondary | ICD-10-CM | POA: Diagnosis not present

## 2022-09-15 DIAGNOSIS — N281 Cyst of kidney, acquired: Secondary | ICD-10-CM | POA: Diagnosis not present

## 2022-09-15 DIAGNOSIS — Z23 Encounter for immunization: Secondary | ICD-10-CM | POA: Diagnosis not present

## 2022-10-08 DIAGNOSIS — I10 Essential (primary) hypertension: Secondary | ICD-10-CM | POA: Diagnosis not present

## 2022-10-08 DIAGNOSIS — K6289 Other specified diseases of anus and rectum: Secondary | ICD-10-CM | POA: Diagnosis not present

## 2022-10-08 DIAGNOSIS — K52839 Microscopic colitis, unspecified: Secondary | ICD-10-CM | POA: Diagnosis not present

## 2022-10-08 DIAGNOSIS — Z79899 Other long term (current) drug therapy: Secondary | ICD-10-CM | POA: Diagnosis not present

## 2022-10-08 DIAGNOSIS — K5730 Diverticulosis of large intestine without perforation or abscess without bleeding: Secondary | ICD-10-CM | POA: Diagnosis not present

## 2022-10-08 DIAGNOSIS — K5281 Eosinophilic gastritis or gastroenteritis: Secondary | ICD-10-CM | POA: Diagnosis not present

## 2022-10-08 DIAGNOSIS — K648 Other hemorrhoids: Secondary | ICD-10-CM | POA: Diagnosis not present

## 2022-10-08 DIAGNOSIS — K644 Residual hemorrhoidal skin tags: Secondary | ICD-10-CM | POA: Diagnosis not present

## 2022-10-08 DIAGNOSIS — K6389 Other specified diseases of intestine: Secondary | ICD-10-CM | POA: Diagnosis not present

## 2022-10-27 DIAGNOSIS — Z1159 Encounter for screening for other viral diseases: Secondary | ICD-10-CM | POA: Diagnosis not present

## 2022-10-27 DIAGNOSIS — K52832 Lymphocytic colitis: Secondary | ICD-10-CM | POA: Diagnosis not present

## 2022-10-27 DIAGNOSIS — K900 Celiac disease: Secondary | ICD-10-CM | POA: Diagnosis not present

## 2022-10-27 DIAGNOSIS — K5281 Eosinophilic gastritis or gastroenteritis: Secondary | ICD-10-CM | POA: Diagnosis not present

## 2022-10-27 DIAGNOSIS — E559 Vitamin D deficiency, unspecified: Secondary | ICD-10-CM | POA: Diagnosis not present

## 2022-10-27 DIAGNOSIS — K529 Noninfective gastroenteritis and colitis, unspecified: Secondary | ICD-10-CM | POA: Diagnosis not present

## 2022-11-14 IMAGING — DX DG SINUSES 1-2V
2 series · 2 of 2 positions shown · non-contrast
Comparison: November 06, 2015

CLINICAL DATA: Sinus congestion for 2 months.

EXAM:
PARANASAL SINUSES - 1-2 VIEW

[dg sinus 1-2 views (1 of 2)]
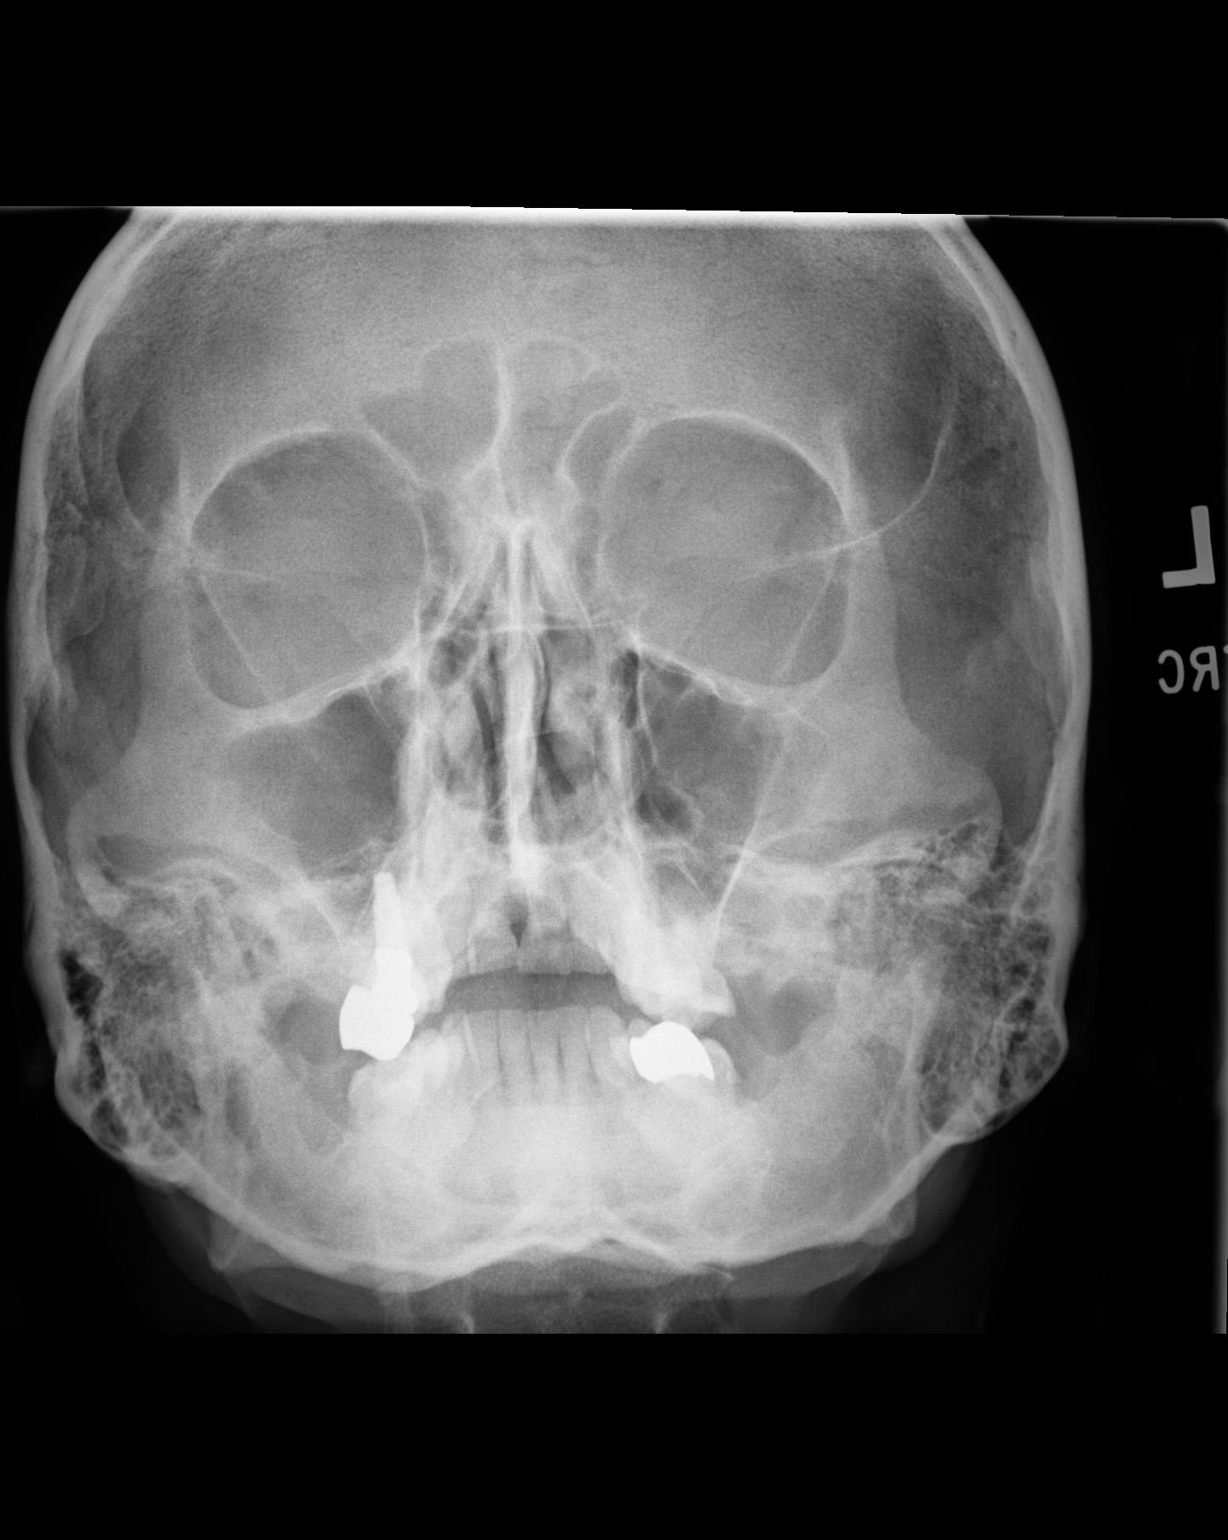

[dg sinus 1-2 views (2 of 2)]
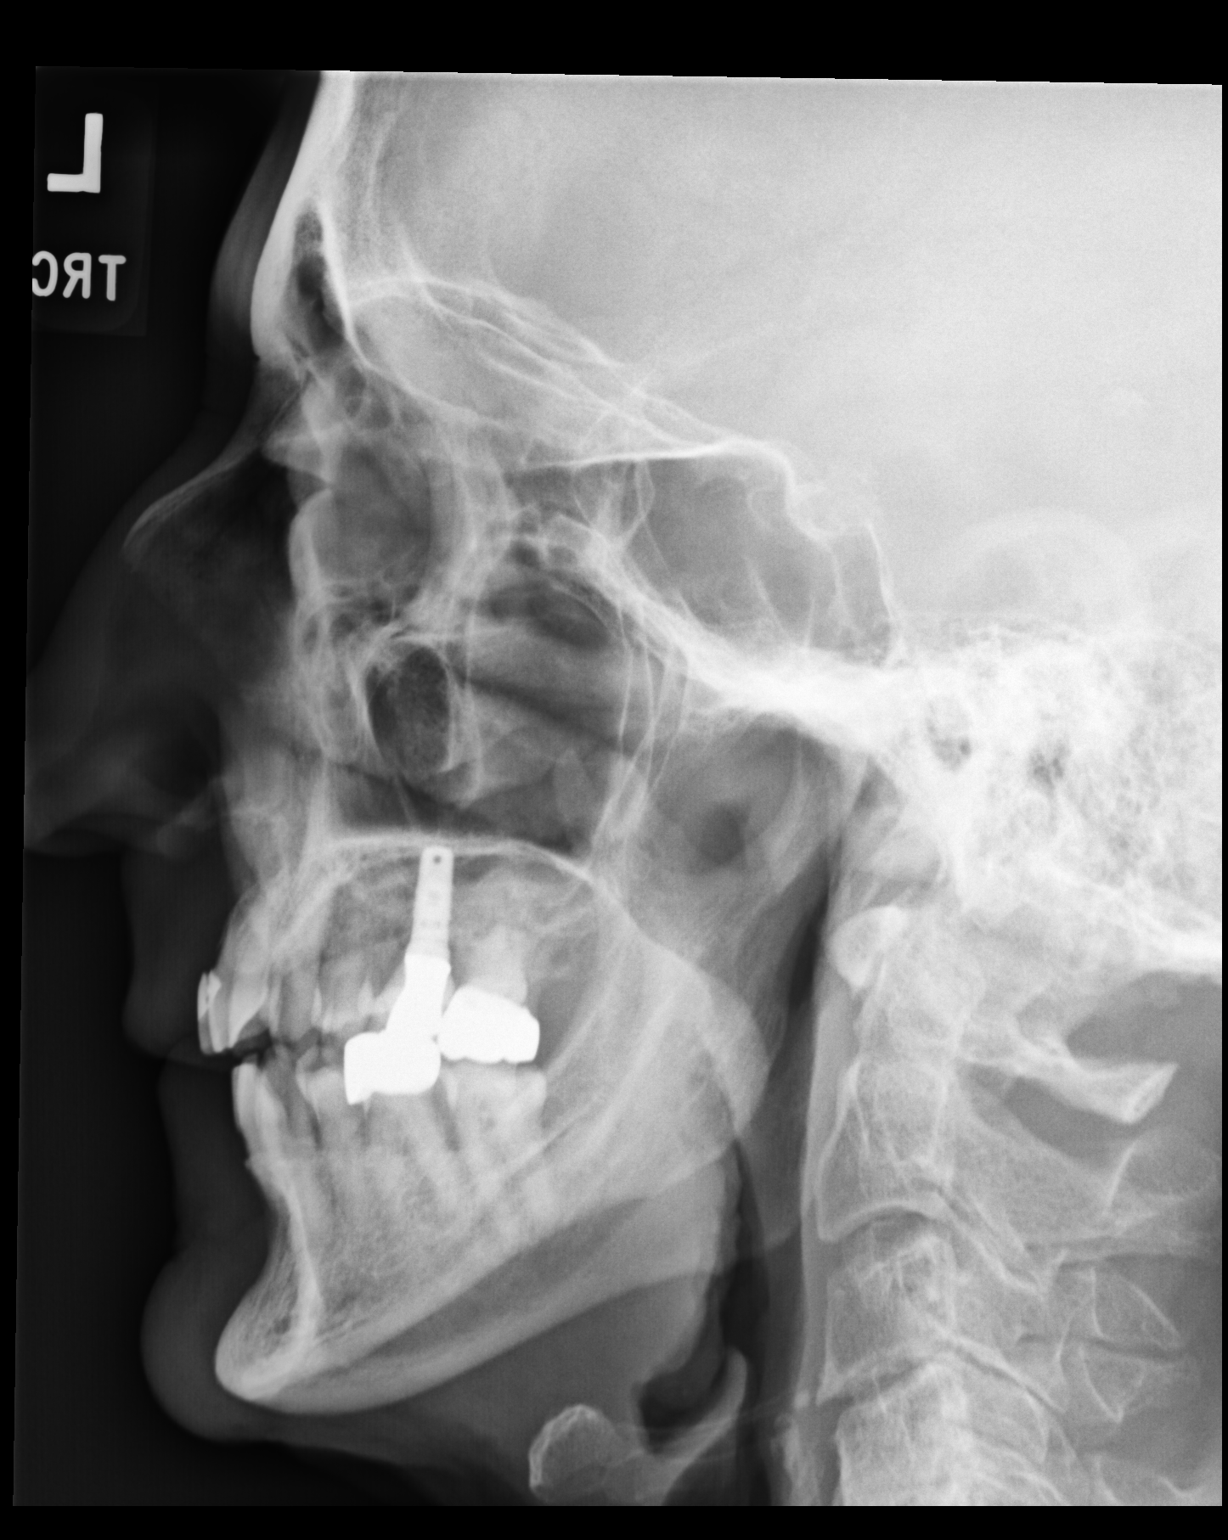

[2 of 2 positions shown; findings below may reference images not displayed]

FINDINGS: The paranasal sinus are aerated. There is no evidence of sinus
opacification air-fluid levels or mucosal thickening. No significant
bone abnormalities are seen.
IMPRESSION: Negative.

## 2022-11-17 DIAGNOSIS — R1310 Dysphagia, unspecified: Secondary | ICD-10-CM | POA: Diagnosis not present

## 2022-11-17 DIAGNOSIS — K5289 Other specified noninfective gastroenteritis and colitis: Secondary | ICD-10-CM | POA: Diagnosis not present

## 2022-11-17 DIAGNOSIS — R251 Tremor, unspecified: Secondary | ICD-10-CM | POA: Diagnosis not present

## 2022-11-17 DIAGNOSIS — Z136 Encounter for screening for cardiovascular disorders: Secondary | ICD-10-CM | POA: Diagnosis not present

## 2022-11-17 DIAGNOSIS — I2510 Atherosclerotic heart disease of native coronary artery without angina pectoris: Secondary | ICD-10-CM | POA: Diagnosis not present

## 2022-11-17 DIAGNOSIS — I10 Essential (primary) hypertension: Secondary | ICD-10-CM | POA: Diagnosis not present

## 2022-11-27 ENCOUNTER — Other Ambulatory Visit: Payer: Self-pay | Admitting: Sports Medicine

## 2022-12-01 DIAGNOSIS — H43812 Vitreous degeneration, left eye: Secondary | ICD-10-CM | POA: Diagnosis not present

## 2022-12-01 DIAGNOSIS — H40023 Open angle with borderline findings, high risk, bilateral: Secondary | ICD-10-CM | POA: Diagnosis not present

## 2022-12-01 DIAGNOSIS — H31091 Other chorioretinal scars, right eye: Secondary | ICD-10-CM | POA: Diagnosis not present

## 2022-12-01 DIAGNOSIS — H2513 Age-related nuclear cataract, bilateral: Secondary | ICD-10-CM | POA: Diagnosis not present

## 2022-12-13 ENCOUNTER — Other Ambulatory Visit: Payer: Self-pay

## 2022-12-13 MED ORDER — GABAPENTIN 300 MG PO CAPS: ORAL_CAPSULE | ORAL | 2 refills | Status: DC

## 2023-01-10 ENCOUNTER — Ambulatory Visit (INDEPENDENT_AMBULATORY_CARE_PROVIDER_SITE_OTHER): Payer: Medicare Other | Admitting: Otolaryngology

## 2023-01-10 ENCOUNTER — Encounter (INDEPENDENT_AMBULATORY_CARE_PROVIDER_SITE_OTHER): Payer: Self-pay

## 2023-01-10 VITALS — Ht 73.0 in | Wt 154.0 lb

## 2023-01-10 DIAGNOSIS — H903 Sensorineural hearing loss, bilateral: Secondary | ICD-10-CM

## 2023-01-10 DIAGNOSIS — J343 Hypertrophy of nasal turbinates: Secondary | ICD-10-CM | POA: Diagnosis not present

## 2023-01-10 DIAGNOSIS — J310 Chronic rhinitis: Secondary | ICD-10-CM | POA: Insufficient documentation

## 2023-01-10 DIAGNOSIS — H6983 Other specified disorders of Eustachian tube, bilateral: Secondary | ICD-10-CM | POA: Diagnosis not present

## 2023-01-10 NOTE — Progress Notes (Signed)
Patient ID: Frank Larson, male   DOB: Mar 25, 1951, 72 y.o.   MRN: [id number]  Cc: Clogging sensation in ears, decreased hearing, chronic nasal congestion  HPI:  Frank Larson is an 72 y.o. male who presents today complaining of clogging sensation in his ears and decreased hearing for the past month.  He also complains of frequent popping and crackling sensations in his ears.  He has a history of bilateral sensorineural hearing loss since his early 37s, likely secondary to hereditary causes.  He has been wearing hearing aids for many years.  Over the past few months, he has also noted increasing nasal congestion.  He was noted to have bilateral middle ear effusion, and was treated with antibiotics.  He has a history of environmental allergies and recurrent sinusitis.  He was previously treated with immunotherapy for more than 10 years.  He also underwent sinus surgery 25 years ago.  Currently he uses over-the-counter allergy medications as needed.  Past Medical History:  Diagnosis Date   Allergy    Arthritis    Basal cell carcinoma 04/28/2016   left cheek tx cx3 and exc   Colitis    Diverticulosis    Hyperlipidemia    off medicines currently    Metatarsal fracture    right    Past Surgical History:  Procedure Laterality Date   COLONOSCOPY  08/07/2002   INGUINAL HERNIA REPAIR     NASAL SINUS SURGERY     NOSE SURGERY     broken nose    Family History  Problem Relation Age of Onset   Hyperlipidemia Father    Heart disease Father    Peptic Ulcer Disease Father    Colitis Father    Parkinson's disease Mother    Peptic Ulcer Disease Mother    Colon cancer Neg Hx    Diabetes Neg Hx    Colon polyps Neg Hx    Esophageal cancer Neg Hx    Rectal cancer Neg Hx    Stomach cancer Neg Hx     Social History:  reports that he has never smoked. He has never used smokeless tobacco. He reports current alcohol use of about 4.0 standard drinks of alcohol per week. He reports that he does not use  drugs.  Allergies: No Known Allergies  Prior to Admission medications   Medication Sig Start Date End Date Taking? Authorizing Provider  amLODipine (NORVASC) 2.5 MG tablet take 1 tablet by mouth Once a day 07/09/20  Yes   atorvastatin (LIPITOR) 20 MG tablet Take 20 mg by mouth daily. 07/11/19  Yes [provider]  Cholecalciferol (VITAMIN D3) 50 MCG (2000 UT) capsule Take by mouth.   Yes [provider]  cyanocobalamin (,VITAMIN B-12,) 1000 MCG/ML injection INJECT 1 ML ONCE MONTHLY 07/09/20  Yes   cyclobenzaprine (FLEXERIL) 5 MG tablet Take 1-2 in the evening for muscle spasms 08/10/22  Yes Enid Baas, MD  fluticasone [facility]) 50 MCG/ACT nasal spray Place 2 sprays into both nostrils daily. 01/28/14  Yes Le, Thao P, DO  gabapentin (NEURONTIN) 300 MG capsule Take 2 capsules (600 mg) three times a day. 12/13/22  Yes Enid Baas, MD  valsartan (DIOVAN) 80 MG tablet Take 80 mg by mouth daily. 07/31/19  Yes [provider]  albuterol (VENTOLIN HFA) 108 (90 Base) MCG/ACT inhaler INHALE 1-2 PUFFS BY MOUTH AS NEEDED BEFORE EXERCISE 05/19/20 05/28/21  Marden Noble, MD  diphenoxylate-atropine (LOMOTIL) 2.5-0.025 MG tablet TAKE 1 TABLET BY MOUTH FOUR TIMES DAILY AS NEEDED  FOR DIARRHEA OR LOOSE STOOLS Patient not taking: Reported on 01/10/2023 03/20/19   Hilarie Fredrickson, MD  predniSONE (DELTASONE) 20 MG tablet Take 1 tablet (20 mg total) by mouth 2 (two) times daily. Patient not taking: Reported on 01/10/2023 08/10/22   Enid Baas, MD  zolpidem (AMBIEN) 10 MG tablet TAKE 1 TABLET BY MOUTH AT BEDTIME IF NEEDED 05/19/20 05/28/21  Marden Noble, MD  Zoster Vaccine Adjuvanted [facility]) injection Inject into the muscle to be administered  now and 2nd dose 2-6 months later Patient not taking: Reported on 01/10/2023 09/24/20   Judyann Munson, MD    Height 6\' 1"  (1.854 m), weight 69.9 kg. Exam: General: Communicates without difficulty, well nourished, no acute distress. Head: Normocephalic, no  evidence injury, no tenderness, facial buttresses intact without stepoff. Face/sinus: No tenderness to palpation and percussion. Facial movement is normal and symmetric. Eyes: PERRL, EOMI. No scleral icterus, conjunctivae clear. Neuro: CN II exam reveals vision grossly intact.  No nystagmus at any point of gaze. Ears: Auricles well formed without lesions.  Ear canals are intact without mass or lesion.  No erythema or edema is appreciated.  The TMs are intact without fluid. Nose: External evaluation reveals normal support and skin without lesions.  Dorsum is intact.  Anterior rhinoscopy reveals congested mucosa over anterior aspect of inferior turbinates and intact septum.  No purulence noted. Oral:  Oral cavity and oropharynx are intact, symmetric, without erythema or edema.  Mucosa is moist without lesions. Neck: Full range of motion without pain.  There is no significant lymphadenopathy.  No masses palpable.  Thyroid bed within normal limits to palpation.  Parotid glands and submandibular glands equal bilaterally without mass.  Trachea is midline. Neuro:  CN 2-12 grossly intact.    Procedure:  Flexible Nasal Endoscopy: Description: Risks, benefits, and alternatives of flexible endoscopy were explained to the patient.  Specific mention was made of the risk of throat numbness with difficulty swallowing, possible bleeding from the nose and mouth, and pain from the procedure.  The patient gave oral consent to proceed.  The flexible scope was inserted into the right nasal cavity.  Endoscopy of the interior nasal cavity, superior, inferior, and middle meatus was performed. The sphenoid-ethmoid recess was examined. Edematous mucosa was noted.  No polyp, mass, or lesion was appreciated. Olfactory cleft was clear.  Nasopharynx was clear.  Turbinates were hypertrophied but without mass.  The procedure was repeated on the contralateral side with similar findings.  The patient tolerated the procedure well.     Assessment: 1.  The patient's history is consistent with bilateral eustachian tube dysfunction. 2.  His ear canals, tympanic membranes, and middle ear spaces are noted to be normal.  His previously noted middle ear effusion has resolved. 3.  Bilateral hereditary sensorineural hearing loss.  He currently wears bilateral hearing aids. 4.  Chronic rhinitis with nasal mucosal congestion and bilateral inferior turbinate hypertrophy.  Plan: 1.  The physical exam and nasal endoscopy findings are reviewed with the patient. 2.  He is reassured and no acute infection or middle ear effusion is noted today. 3.  Flonase nasal spray 2 sprays each nostril daily.  The importance of consistent daily use is discussed. 4.  Valsalva exercise multiple times a day. 5.  The patient will return for reevaluation in 1 month.  If he continues to be symptomatic, we will consider myringotomy and tube placement.  Deborahann Poteat W Allysen Lazo 01/10/2023, 8:20 PM

## 2023-02-10 ENCOUNTER — Ambulatory Visit (INDEPENDENT_AMBULATORY_CARE_PROVIDER_SITE_OTHER): Payer: Medicare Other

## 2023-03-03 ENCOUNTER — Ambulatory Visit: Payer: Medicare Other | Admitting: Sports Medicine

## 2023-03-03 ENCOUNTER — Other Ambulatory Visit: Payer: Self-pay

## 2023-03-03 VITALS — BP 124/68 | Ht 72.5 in | Wt 148.0 lb

## 2023-03-03 DIAGNOSIS — G8929 Other chronic pain: Secondary | ICD-10-CM | POA: Diagnosis not present

## 2023-03-03 DIAGNOSIS — M25532 Pain in left wrist: Secondary | ICD-10-CM | POA: Diagnosis not present

## 2023-03-03 DIAGNOSIS — Q6672 Congenital pes cavus, left foot: Secondary | ICD-10-CM

## 2023-03-03 DIAGNOSIS — M25531 Pain in right wrist: Secondary | ICD-10-CM

## 2023-03-03 DIAGNOSIS — M5441 Lumbago with sciatica, right side: Secondary | ICD-10-CM

## 2023-03-03 DIAGNOSIS — Q6671 Congenital pes cavus, right foot: Secondary | ICD-10-CM | POA: Diagnosis not present

## 2023-03-03 DIAGNOSIS — M25551 Pain in right hip: Secondary | ICD-10-CM

## 2023-03-03 NOTE — Progress Notes (Signed)
PCP: Marden Noble, MD (Inactive)  SUBJECTIVE:   HPI:  Patient is a 72 y.o. male here with chief complaint of right sided low back pain, bilateral wrist pain, and bilateral foot pain.   He is a very active gentleman and has been having issues with low back pain for the past 2 years. He has done formal PT which has been helpful and maintains his lumbar home exercises 5-6 days/wk. He notes over the past few months his pain has been worsening and affecting his exercise (swimming, elliptical, and recumbent biking). Pain has been leading him to modify his swimming and using a pull buoy, but still getting pain. He occasionally gets flares with right sided radiculopathy and sciatica that is self-limited and typically resolves in about a week, most recently last week. Pain will radiate across his buttock to his lateral right hip. Currently without sciatica type sxs. He is on Gabapentin 600mg  TID and notes this has been helpful. Denies sedation, fatigue, or issues with this medication. Will occasionally also take naproxen which also helps.   Regarding his wrists, this has been an issue for him over the past year as well. Has been told he has arthritis in his radiocarpal joint. Has not tried any specific therapies for this to-date. Gets pain with lifting weights and when his hands connect with the wall of the pool.  He is also hoping to get updated orthotics as his are worn through at the ball of his feet and causing pain there with activity. He has worn through his previous orthotics.  ROS:     See HPI  PERTINENT  PMH / PSH FH / / SH:  Past Medical, Surgical, Social, and Family History Reviewed & Updated in the EMR.  Pertinent findings include:  Known severe DDD at L5/S1 with disc herniation at L5-S1 on MRI 04/2022  No Known Allergies  OBJECTIVE:  BP 124/68   Ht 6' 0.5" (1.842 m)   Wt 148 lb (67.1 kg)   BMI 19.80 kg/m   PHYSICAL EXAM:  GEN: Alert and Oriented, NAD, comfortable in exam  room RESP: Unlabored respirations, symmetric chest rise PSY: normal mood, congruent affect   Back Exam: No gross deformity, scoliosis. TTP over L5 in the midline and right transverse processes. No significant lumbar paraspinal TTP bilaterally. No SI joint TTP. He is TTP along his iliac crest and throughout his glute med out to his greater trochanter on the left.   His hip ROM is full, however + FABER, - FADIR, - piriformis stretch, - stinchfield and log roll. 4/5 strength with hip abduction. Negative SLRs. Sensation intact to light touch bilaterally.  Wrist Exam Bilateral Arthritic changes of his CMC joints bilaterally with squaring off of the joints. Otherwise no appreciable bony deformity, swelling or bruising. Prominent veins present. His wrists and thumbs have full ROM. Strength in bilateral wrist and digits full, normal grip and pincer strength. Pain with resisted thumb abduction on the left. TTP over CMC joints, L>R, radiocarpal joints L>R. No significant TTP with palpation of the 1st compartments. + Finkelsteins bilaterally, L>R. Mild + CMC grind bilaterally, L>R   Limited US of bilateral wrists: -Left first dorsal compartment with hypoechoic changes of the abductor pollicis longus noted on SAX. No significant tearing noted in 1st compartment. No significant hypoechoic fluid collection around the 1st comp in both SAX and LAX. -Left radiocarpal joint with moderate hypoechoic fluid collection -Left CMC joint with cortical changes and bone spurring, decreased joint space on the dorsal  surface and moderate hypoechoic fluid collection. -Right first dorsal compartment with normal appearing tendon fibers in both SAX and LAX, no tearing or hypoechoic changes. -Right radiocarpal joint without appreciable swelling or cortical irregularities. -Right CMC joint with cortical irregularities and bone spurring, though relatively preserved joint space and no significant effusion.  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 Chronic appearing tendinopathy of the left 1st dorsal compartment. U/S performed by Glean Salen, MD with supervision of Roanna Epley, MD. Assessment & Plan Pain in both wrists Based on his exam and ultrasound findings he does have bilateral CMC joint arthritis as well as some left-sided first compartment tendinopathy and radiocarpal joint arthritis.  We discussed management of this with relative rest, wrist wraps with activity such as swimming or weightlifting, and as needed topical Voltaren use.  Will monitor his response to this and may consider cortisone injections under ultrasound guidance in the future pending his response to more conservative management. Chronic right-sided low back pain with right-sided sciatica This is an acute on chronic issue for him with intermittent sciatica though none present on his exam today or in the past week on history.  He is on gabapentin 600 mg 3 times daily and is tolerating this well.  We discussed possibly increasing his gabapentin however predominantly his pain today seems to be more from greater trochanteric pain syndrome as discussed below.  He will continue his home exercises for his low back pain as well as incorporate the GT PS exercises discussed below.  Pending his response to these exercises may consider increasing his gabapentin at subsequent visits.  Will follow-up with him in 6 weeks. Greater trochanteric pain syndrome of right lower extremity His exam today is most consistent with greater trochanteric pain syndrome on the right side.  We reviewed home exercises to start to incorporate 3 to 4 days weekly to focus on stretching and strengthening his glutes/hip abductors.  Consider cortisone injections for this if failing to improve with home exercises.  Follow-up in 6 weeks. Pes cavus of both feet Patient requesting a new pair of orthotics today as his are  currently worn through and he is getting pain at the balls of his feet.  We did not have time today to create new orthotics however we will have our scheduler reach out to him to schedule a return visit to focus primarily on this issue.   Glean Salen, MD PGY-4, Sports Medicine Fellow [facility]  I observed and examined the patient with the [facility] resident and agree with assessment and plan.  Note reviewed and modified by me. Sterling Big, MD

## 2023-03-03 NOTE — Assessment & Plan Note (Signed)
This is an acute on chronic issue for him with intermittent sciatica though none present on his exam today or in the past week on history.  He is on gabapentin 600 mg 3 times daily and is tolerating this well.  We discussed possibly increasing his gabapentin however predominantly his pain today seems to be more from greater trochanteric pain syndrome as discussed below.  He will continue his home exercises for his low back pain as well as incorporate the GT PS exercises discussed below.  Pending his response to these exercises may consider increasing his gabapentin at subsequent visits.  Will follow-up with him in 6 weeks.

## 2023-03-16 ENCOUNTER — Ambulatory Visit: Payer: Medicare Other | Admitting: Sports Medicine

## 2023-03-16 VITALS — BP 124/86 | Ht 72.5 in | Wt 148.0 lb

## 2023-03-16 DIAGNOSIS — Q6672 Congenital pes cavus, left foot: Secondary | ICD-10-CM | POA: Diagnosis not present

## 2023-03-16 DIAGNOSIS — Q6671 Congenital pes cavus, right foot: Secondary | ICD-10-CM | POA: Diagnosis not present

## 2023-03-16 NOTE — Progress Notes (Unsigned)
   PCP: Marden Noble, MD (Inactive)  SUBJECTIVE:   HPI:  Patient is a 72 y.o. male here for orthotics. Has longstanding pes cavus and feels his current pair of orthotics have worn down in the balls of the feet. No new complaints.  ROS:     See HPI  PERTINENT  PMH / PSH FH / / SH:  Past Medical, Surgical, Social, and Family History Reviewed & Updated in the EMR.  Pertinent findings include:  Non-contributory   OBJECTIVE:  BP 124/86   Ht 6' 0.5" (1.842 m)   Wt 148 lb (67.1 kg)   BMI 19.80 kg/m   PHYSICAL EXAM:  GEN: Alert and Oriented, NAD, comfortable in exam room RESP: Unlabored respirations, symmetric chest rise PSY: normal mood, congruent affect   MSK EXAM: Bilateral feet with pes cavus. Fairly well preserved transverse arches. Moderate splaying between toes 1-2 of the right.   Assessment & Plan Pes cavus of both feet Updated orthotics as below. Will follow-up PRN.  Patient was fitted for a: standard, cushioned, semi-rigid orthotic. The orthotic was heated and afterward the patient stood on the orthotic blank positioned on the orthotic stand. The patient was positioned in subtalar neutral position and 10 degrees of ankle dorsiflexion in a weight bearing stance on the heated orthotic blank. After completion of molding, a stable base was applied to the orthotic blank. The blank was ground to a stable position for weight bearing. Size: 11 Base: Blue med density EVA Posting: None Additional orthotic padding: 5/16" inch heel lift in right   Glean Salen, MD PGY-4, Sports Medicine Fellow [facility]  Addendum:  I was the preceptor for this visit and available for immediate consultation.  Norton Blizzard MD Marrianne Mood

## 2023-03-21 DIAGNOSIS — L821 Other seborrheic keratosis: Secondary | ICD-10-CM | POA: Diagnosis not present

## 2023-03-21 DIAGNOSIS — C44619 Basal cell carcinoma of skin of left upper limb, including shoulder: Secondary | ICD-10-CM | POA: Diagnosis not present

## 2023-03-21 DIAGNOSIS — C44519 Basal cell carcinoma of skin of other part of trunk: Secondary | ICD-10-CM | POA: Diagnosis not present

## 2023-03-21 DIAGNOSIS — L570 Actinic keratosis: Secondary | ICD-10-CM | POA: Diagnosis not present

## 2023-03-21 DIAGNOSIS — L820 Inflamed seborrheic keratosis: Secondary | ICD-10-CM | POA: Diagnosis not present

## 2023-03-21 DIAGNOSIS — L814 Other melanin hyperpigmentation: Secondary | ICD-10-CM | POA: Diagnosis not present

## 2023-03-24 ENCOUNTER — Other Ambulatory Visit: Payer: Self-pay | Admitting: *Deleted

## 2023-03-24 MED ORDER — GABAPENTIN 300 MG PO CAPS: ORAL_CAPSULE | ORAL | 2 refills | Status: DC

## 2023-04-05 ENCOUNTER — Other Ambulatory Visit: Payer: Self-pay | Admitting: *Deleted

## 2023-04-05 MED ORDER — GABAPENTIN 300 MG PO CAPS: ORAL_CAPSULE | ORAL | 2 refills | Status: DC

## 2023-04-20 DIAGNOSIS — C44619 Basal cell carcinoma of skin of left upper limb, including shoulder: Secondary | ICD-10-CM | POA: Diagnosis not present

## 2023-05-06 ENCOUNTER — Telehealth: Payer: Self-pay | Admitting: *Deleted

## 2023-05-06 ENCOUNTER — Encounter: Payer: Self-pay | Admitting: Internal Medicine

## 2023-05-06 ENCOUNTER — Ambulatory Visit: Payer: Medicare Other | Admitting: Internal Medicine

## 2023-05-06 ENCOUNTER — Other Ambulatory Visit: Payer: Self-pay

## 2023-05-06 VITALS — BP 110/64 | HR 48 | Temp 98.3°F | Resp 15 | Ht 72.75 in | Wt 154.7 lb

## 2023-05-06 DIAGNOSIS — J320 Chronic maxillary sinusitis: Secondary | ICD-10-CM | POA: Diagnosis not present

## 2023-05-06 DIAGNOSIS — R0683 Snoring: Secondary | ICD-10-CM | POA: Diagnosis not present

## 2023-05-06 DIAGNOSIS — H6983 Other specified disorders of Eustachian tube, bilateral: Secondary | ICD-10-CM

## 2023-05-06 DIAGNOSIS — H9193 Unspecified hearing loss, bilateral: Secondary | ICD-10-CM | POA: Diagnosis not present

## 2023-05-06 MED ORDER — IPRATROPIUM BROMIDE 0.06 % NA SOLN: 2.0000 | Freq: Four times a day (QID) | NASAL | 12 refills | Status: AC

## 2023-05-06 MED ORDER — FLUTICASONE PROPIONATE 50 MCG/ACT NA SUSP: 1.0000 | Freq: Two times a day (BID) | NASAL | 2 refills | Status: AC

## 2023-05-06 MED ORDER — AZELASTINE HCL 0.1 % NA SOLN: 2.0000 | Freq: Two times a day (BID) | NASAL | 12 refills | Status: AC

## 2023-05-06 MED ORDER — MONTELUKAST SODIUM 10 MG PO TABS: 10.0000 mg | ORAL_TABLET | Freq: Every day | ORAL | 1 refills | Status: AC

## 2023-05-06 NOTE — Progress Notes (Signed)
 NEW PATIENT Date of Service/Encounter:  05/06/23 Referring provider: none-self referred Primary care provider: Charlott Dorn LABOR, MD  Subjective:  Frank Larson is a 73 y.o. male  presenting today for evaluation of  ear fullness    History obtained from: chart review and patient.   Discussed the use of AI scribe software for clinical note transcription with the patient, who gave verbal consent to proceed.  History of Present Illness   Frank Larson Frank Larson is a 73 year old male with chronic allergies who presents with hearing loss and sinus congestion. He is accompanied by his wife, who is concerned about his hearing loss.  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 He uses hearing aids, which are new and effective, but he still struggles with hearing, especially during flights when he has difficulty equalizing ear pressure.  He experiences sinus congestion and has a history of frequent sinus infections, typically worsening in the fall. He usually has a sinus infection every November. Despite several freezes in December, his symptoms have persisted. He reports postnasal drip, congestion, sore throat, and a diminished sense of smell, which have worsened since flying last week. He has taken many antibiotics in the past, leading to resistance, and mentions that only one antibiotic is effective now.  He has chronic allergies that are present year-round, worsening in the fall. He received allergy shots for about ten years, which provided some relief, but he stopped them years ago. He uses fluticasone  and a neti pot regularly, but his symptoms have not improved.  He underwent sinus surgery approximately 25-27 years ago to address snoring, but it was ineffective. His snoring has worsened over time, affecting his wife's sleep. No heartburn, but he experiences occasional reflux.  He takes antihistamines and decongestants,  specifically fexofenadine, which provides some relief. He has used prednisone  in the past for another issue but is hesitant to use it again unless necessary.        Other allergy screening: Asthma: no Rhino conjunctivitis: yes Food allergy: no Medication allergy: yes Hymenoptera allergy: no Urticaria: no Eczema:no History of recurrent infections suggestive of immunodeficency: no Vaccinations are up to date.   Past Medical History: Past Medical History:  Diagnosis Date   Allergy    Arthritis    Basal cell carcinoma 04/28/2016   left cheek tx cx3 and exc   Colitis    Diverticulosis    Eczema    Hyperlipidemia    off medicines currently    Metatarsal fracture    right   Medication List:  Current Outpatient Medications  Medication Sig Dispense Refill   amLODipine  (NORVASC ) 5 MG tablet Take 5 mg by mouth daily.     atorvastatin  (LIPITOR) 20 MG tablet Take 20 mg by mouth daily.     azelastine  (ASTELIN ) 0.1 % nasal spray Place 2 sprays into both nostrils 2 (two) times daily. Use in each nostril as directed 30 mL 12   Cholecalciferol (VITAMIN D3) 50 MCG (2000 UT) capsule Take by mouth.     cyanocobalamin  (,VITAMIN B-12,) 1000 MCG/ML injection INJECT 1 ML ONCE MONTHLY 6 mL 5   fluticasone  (FLONASE ) 50 MCG/ACT nasal spray Place 1 spray into both nostrils 2 (two) times daily. 16 g 2   gabapentin  (NEURONTIN ) 300 MG capsule Take 2 capsules (600 mg) three times a day. 180 capsule 2   ipratropium (ATROVENT ) 0.06 % nasal spray Place 2 sprays into both nostrils 4 (four) times  daily. 15 mL 12   montelukast  (SINGULAIR ) 10 MG tablet Take 1 tablet (10 mg total) by mouth at bedtime. 90 tablet 1   valsartan  (DIOVAN ) 80 MG tablet Take 80 mg by mouth daily.     albuterol  (VENTOLIN  HFA) 108 (90 Base) MCG/ACT inhaler INHALE 1-2 PUFFS BY MOUTH AS NEEDED BEFORE EXERCISE 36 g 2   amLODipine  (NORVASC ) 2.5 MG tablet take 1 tablet by mouth Once a day (Patient not taking: Reported on 05/06/2023) 90 tablet 3    cyclobenzaprine  (FLEXERIL ) 5 MG tablet Take 1-2 in the evening for muscle spasms (Patient not taking: Reported on 05/06/2023) 30 tablet 0   diphenoxylate -atropine  (LOMOTIL ) 2.5-0.025 MG tablet TAKE 1 TABLET BY MOUTH FOUR TIMES DAILY AS NEEDED FOR DIARRHEA OR LOOSE STOOLS (Patient not taking: Reported on 05/06/2023) 360 tablet 1   predniSONE  (DELTASONE ) 20 MG tablet Take 1 tablet (20 mg total) by mouth 2 (two) times daily. (Patient not taking: Reported on 05/06/2023) 14 tablet 0   zolpidem  (AMBIEN ) 10 MG tablet TAKE 1 TABLET BY MOUTH AT BEDTIME IF NEEDED 30 tablet 2   Zoster Vaccine Adjuvanted (SHINGRIX ) injection Inject into the muscle to be administered  now and 2nd dose 2-6 months later (Patient not taking: Reported on 05/06/2023) 1 each 1   No current facility-administered medications for this visit.   Known Allergies:  No Known Allergies Past Surgical History: Past Surgical History:  Procedure Laterality Date   COLONOSCOPY  08/07/2002   INGUINAL HERNIA REPAIR     NASAL SINUS SURGERY     NOSE SURGERY     broken nose   Family History: Family History  Problem Relation Age of Onset   Hyperlipidemia Father    Heart disease Father    Peptic Ulcer Disease Father    Colitis Father    Parkinson's disease Mother    Peptic Ulcer Disease Mother    Colon cancer Neg Hx    Diabetes Neg Hx    Colon polyps Neg Hx    Esophageal cancer Neg Hx    Rectal cancer Neg Hx    Stomach cancer Neg Hx    Social History: [place] lives [facility] [place] [facility], no water damage or roaches, wood throughout, gas heat, central cooling, dog with access to bedroom, no tobacco exposure,  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   ROS:  All other systems negative except as noted per HPI.  Objective:  Blood pressure 110/64, pulse (!) 48, temperature 98.3 F (36.8 C), temperature source Temporal, resp. rate 15, height 6' 0.75 (1.848 m), weight 154 lb 11.2 oz (70.2 kg), SpO2 98%. Body mass index is 20.55  kg/m. Physical Exam:  General Appearance:  Alert, cooperative, no distress, appears stated age  Head:  Normocephalic, without obvious abnormality, atraumatic  Eyes:  Conjunctiva clear, EOM's intact  Ears TM- Serous effusions bilaterally  and EACs normal bilaterally  Nose: Nares normal,  erythematous nasal mucosa , no visible anterior polyps, and septum midline  Throat: Lips, tongue normal; teeth and gums normal, + cobblestoning and mildly erythematous posterior oropharynx  Neck: Supple, symmetrical  Lungs:   clear to auscultation bilaterally, Respirations unlabored, no coughing  Heart:  regular rate and rhythm and no murmur, Appears well perfused  Extremities: No edema  Skin: Skin color, texture, turgor normal and no rashes or lesions on visualized portions of skin  Neurologic: No gross deficits   Diagnostics: None done    Labs:  Lab Orders  Allergens Zone 2       Assessment and Plan  Assessment and Plan    Chronic Rhinitis Seasonal and Perennial Allergic: with possible CRS and ETD Persistent hearing loss since September, with history of allergies and sinus infections. Physical exam shows fluid in the ears and red posterior throat. Patient has difficulty equalizing ears, especially during flights.  Will get sinus CT  -Consider short course of prednisone  and antibiotics if CT scan is clear. -If CT scan shows evidence of chronic sinusitis, discuss 4-week course of antibiotics plus prednisone . - Repeat Blood work for environmental allergy   - Prevention:  - allergen avoidance when possible - consider allergy shots as long term control of your symptoms by teaching your immune system to be more tolerant of your allergy triggers  - Symptom control: - Start Nasal Steroid Spray: Best results if used daily. - Options include Flonase  (fluticasone ), Nasocort (triamcinolone), Nasonex (mometasome) 1- 2 sprays in each nostril daily.  - All can be purchased over-the-counter if  not covered by insurance. - Start Astelin  (azelastine ) 1-2 sprays in each nostril twice a day as needed for nasal congestion/itchy nose - Start Atrovent  (Ipratropium Bromide ) 1-2 sprays in each nostril up to 3 times a day as needed for runny nose/post nasal drip/drainage.   - Use less frequently if airway gets too dry. - Start Singulair  (Montelukast ) 10mg  nightly.   - Discontinue if nightmares of behavior changes. - Continue Antihistamine: daily or daily as needed.   -Options include Zyrtec (Cetirizine) 10mg , Claritin (Loratadine) 10mg , Allegra (Fexofenadine) 180mg , or Xyzal (Levocetirinze) 5mg  - Can be purchased over-the-counter if not covered by insurance.    Follow up:  we we will call you with sinus CT results and next labs   This note in its entirety was forwarded to the Provider who requested this consultation.  Other:     Thank you for your kind referral. I appreciate the opportunity to take part in Zalmen's care. Please do not hesitate to contact me with questions.  Sincerely,  Thank you so much for letting me partake in your care today.  Don't hesitate to reach out if you have any additional concerns!  Hargis Springer, MD  [facility] [place], [place]

## 2023-05-06 NOTE — Telephone Encounter (Signed)
 Office notes have been faxed to the above mentioned fax number for PA review.

## 2023-05-06 NOTE — Telephone Encounter (Signed)
 CT Maxillofacial W/O Contrast-[id number] has been ordered for the patient to be performed at [facility] [address] [place]. 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 Called the patient's insurance and initiated a Prior Authorization, they are requiring office notes to be faxed to [phone] to the Attention of RN Viewer with Member name, ID, DOB, and Reference number [id number]. Currently waiting for office notes to be completed by Dr. Lorin then will fax them over to the office.

## 2023-05-06 NOTE — Patient Instructions (Addendum)
 Chronic Rhinitis Seasonal and Perennial Allergic: with possible CRS and ETD Persistent hearing loss since September, with history of allergies and sinus infections. Physical exam shows fluid in the ears and red posterior throat. Patient has difficulty equalizing ears, especially during flights.  Will get sinus CT  -Consider short course of prednisone  and antibiotics if CT scan is clear. -If CT scan shows evidence of chronic sinusitis, discuss 4-week course of antibiotics plus prednisone . - Repeat Blood work for environmental allergy   - Prevention:  - allergen avoidance when possible - consider allergy shots as long term control of your symptoms by teaching your immune system to be more tolerant of your allergy triggers  - Symptom control: - Start Nasal Steroid Spray: Best results if used daily. - Options include Flonase  (fluticasone ), Nasocort (triamcinolone), Nasonex (mometasome) 1- 2 sprays in each nostril daily.  - All can be purchased over-the-counter if not covered by insurance. - Start Astelin  (azelastine ) 1-2 sprays in each nostril twice a day as needed for nasal congestion/itchy nose - Start Atrovent  (Ipratropium Bromide ) 1-2 sprays in each nostril up to 3 times a day as needed for runny nose/post nasal drip/drainage.   - Use less frequently if airway gets too dry. - Start Singulair  (Montelukast ) 10mg  nightly.   - Discontinue if nightmares of behavior changes. - Continue Antihistamine: daily or daily as needed.   -Options include Zyrtec (Cetirizine) 10mg , Claritin (Loratadine) 10mg , Allegra (Fexofenadine) 180mg , or Xyzal (Levocetirinze) 5mg  - Can be purchased over-the-counter if not covered by insurance.    Follow up:  we we will call you with sinus CT results and next labs  Thank you so much for letting me partake in your care today.  Don't hesitate to reach out if you have any additional concerns!  Hargis Springer, MD  [facility] [place], [place]

## 2023-05-09 LAB — IGE+ALLERGENS ZONE 2(30)
Alternaria Alternata IgE: 1.98 kU/L — AB
Amer Sycamore IgE Qn: <0.1 kU/L
Aspergillus Fumigatus IgE: <0.1 kU/L
Bahia Grass IgE: 0.33 kU/L — AB
Bermuda Grass IgE: 0.11 kU/L — AB
Cat Dander IgE: <0.1 kU/L
Cedar, Mountain IgE: 0.42 kU/L — AB
Cladosporium Herbarum IgE: <0.1 kU/L
Cockroach, American IgE: <0.1 kU/L
Common Silver Birch IgE: 0.27 kU/L — AB
D Farinae IgE: <0.1 kU/L
D Pteronyssinus IgE: <0.1 kU/L
Dog Dander IgE: <0.1 kU/L
Elm, American IgE: <0.1 kU/L
Hickory, White IgE: 0.24 kU/L — AB
IgE (Immunoglobulin E), Serum: 99 [IU]/mL (ref 6–495)
Johnson Grass IgE: 0.22 kU/L — AB
Maple/Box Elder IgE: <0.1 kU/L
Mucor Racemosus IgE: <0.1 kU/L
Mugwort IgE Qn: <0.1 kU/L
Nettle IgE: <0.1 kU/L
Oak, White IgE: 1.96 kU/L — AB
Penicillium Chrysogen IgE: <0.1 kU/L
Pigweed, Rough IgE: <0.1 kU/L
Plantain, English IgE: <0.1 kU/L
Ragweed, Short IgE: <0.1 kU/L
Sheep Sorrel IgE Qn: <0.1 kU/L
Stemphylium Herbarum IgE: 1.37 kU/L — AB
Sweet gum IgE RAST Ql: <0.1 kU/L
Timothy Grass IgE: 1.94 kU/L — AB
White Mulberry IgE: <0.1 kU/L

## 2023-05-10 DIAGNOSIS — C44519 Basal cell carcinoma of skin of other part of trunk: Secondary | ICD-10-CM | POA: Diagnosis not present

## 2023-05-10 NOTE — Telephone Encounter (Signed)
PA is still pending.

## 2023-05-11 ENCOUNTER — Telehealth: Payer: Self-pay

## 2023-05-11 DIAGNOSIS — K5289 Other specified noninfective gastroenteritis and colitis: Secondary | ICD-10-CM | POA: Diagnosis not present

## 2023-05-11 DIAGNOSIS — G4700 Insomnia, unspecified: Secondary | ICD-10-CM | POA: Diagnosis not present

## 2023-05-11 DIAGNOSIS — I2510 Atherosclerotic heart disease of native coronary artery without angina pectoris: Secondary | ICD-10-CM | POA: Diagnosis not present

## 2023-05-11 DIAGNOSIS — I10 Essential (primary) hypertension: Secondary | ICD-10-CM | POA: Diagnosis not present

## 2023-05-11 NOTE — Telephone Encounter (Signed)
[facility] called and stated the authorization is still pending with an expected response date on 2/19.  They cancelled his CT and stated they will call Frank Larson and inform him and once they receive the authorization, they will go ahead and reschedule him.

## 2023-05-11 NOTE — Telephone Encounter (Signed)
Mr Minerva Fester called states they received the additional information for CT Scan and cpt [phone] after review they need a pier to pier review. Please have Dr Marlynn Perking or designated provider call [phone]

## 2023-05-12 ENCOUNTER — Other Ambulatory Visit: Payer: Medicare Other

## 2023-05-12 NOTE — Telephone Encounter (Signed)
PA is still pending.

## 2023-05-12 NOTE — Telephone Encounter (Signed)
Pa is approved feb 7-march 8th thru [employer] [facility] [place]

## 2023-05-13 NOTE — Telephone Encounter (Signed)
I called and spoke with [facility] and provided approval number [id number]. They are going to call the patient and schedule the CT Scan. Called patient and informed, patient verbalized understanding.

## 2023-05-16 ENCOUNTER — Ambulatory Visit
Admission: RE | Admit: 2023-05-16 | Discharge: 2023-05-16 | Disposition: A | Discharge status: Home / Self Care | Payer: Medicare Other | Source: Ambulatory Visit | Attending: Internal Medicine

## 2023-05-16 DIAGNOSIS — J320 Chronic maxillary sinusitis: Secondary | ICD-10-CM | POA: Diagnosis not present

## 2023-05-17 NOTE — Progress Notes (Signed)
 Blood work positive to grass pollen, mold, tree.  We are still working on the prior authorization for the CT scan.  Can someone let patient know?

## 2023-05-18 NOTE — Progress Notes (Signed)
 Patient called back and was advised of lab results. Avoidance measures have been mailed to the patient's home. Patient verbalized understanding.

## 2023-05-20 ENCOUNTER — Other Ambulatory Visit: Payer: Self-pay

## 2023-05-20 ENCOUNTER — Encounter: Payer: Self-pay | Admitting: Internal Medicine

## 2023-05-20 ENCOUNTER — Ambulatory Visit: Payer: Medicare Other | Admitting: Internal Medicine

## 2023-05-20 VITALS — BP 132/62 | HR 56 | Temp 98.3°F | Resp 16

## 2023-05-20 DIAGNOSIS — J3089 Other allergic rhinitis: Secondary | ICD-10-CM | POA: Diagnosis not present

## 2023-05-20 DIAGNOSIS — H9193 Unspecified hearing loss, bilateral: Secondary | ICD-10-CM

## 2023-05-20 DIAGNOSIS — J328 Other chronic sinusitis: Secondary | ICD-10-CM | POA: Diagnosis not present

## 2023-05-20 DIAGNOSIS — H6983 Other specified disorders of Eustachian tube, bilateral: Secondary | ICD-10-CM | POA: Diagnosis not present

## 2023-05-20 NOTE — Progress Notes (Signed)
 FOLLOW UP Date of Service/Encounter:  05/23/23  Subjective:  Frank Larson (DOB: 12-19-1950) is a 73 y.o. male who [facility] in re-evaluation of the following: rhinitis, ETD, CRS  History obtained from: chart review and patient.  For Review, LV was on 05/06/23  with Dr. Marlynn Perking seen for intial visit for chronic sinusitis  . See below for summary of history and diagnostics.   Therapeutic plans/changes recommended: CT sinus ordered, started on Atrovent, Singulair, antihistamines, Astelin, Flonase, specific IgE obtained which was positive to grass, mold, tree -----------------------------------------------------  Today presents for follow-up. Discussed the use of AI scribe software for clinical note transcription with the patient, who gave verbal consent to proceed.  History of Present Illness   Frank Larson "Frank Larson" is a 73 year old male who presents with chronic sinus and eustachian tube dysfunction symptoms.  He has a history of chronic sinus issues that began in September, characterized by significant nasal congestion described as 'wall to wall snot'. This has improved with the use of over-the-counter antihistamines and decongestants. Recently, he experienced a sore throat and tender glands, which he attributes to ongoing sinus problems.  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 He reports a history of fluid behind the ear as noted by an audiologist and a brief consultation with an ENT. He performs maneuvers to equalize ear pressure, which sometimes help. He has previously used Afrin during flights and is aware of the potential for rebound congestion with overuse.  CT sinus has been done, but not formally read.  No overt sinusitis based on my read.    He mentions a history of taking multiple antibiotics, which he feels have been ineffective, and  expresses concern about their overuse.  He is reluctant to start prednisone as well         All medications reviewed by clinical staff and updated in chart. No new pertinent medical or surgical history except as noted in HPI.  ROS: All others negative except as noted per HPI.   Objective:  BP 132/62 (BP Location: Right Arm, Patient Position: Sitting, Cuff Size: Normal)   Pulse (!) 56   Temp 98.3 F (36.8 C) (Oral)   Resp 16   SpO2 97%  There is no height or weight on file to calculate BMI. Physical Exam: General Appearance:  Alert, cooperative, no distress, appears stated age  Head:  Normocephalic, without obvious abnormality, atraumatic  Eyes:  Conjunctiva clear, EOM's intact  Ears Hearing aids bilaterally, serous fluid behind both tympanic membranes and EACs normal bilaterally  Nose: Nares normal,  erythematous nasal, no visible anterior polyps, and septum midline  Throat: Lips, tongue normal; teeth and gums normal, + cobblestoning  Neck: Supple, symmetrical  Lungs:   clear to auscultation bilaterally, Respirations unlabored, no coughing  Heart:  regular rate and rhythm and no murmur, Appears well perfused  Extremities: No edema  Skin: Skin color, texture, turgor normal and no rashes or lesions on visualized portions of skin  Neurologic: No gross deficits   Labs:  Lab Orders  No laboratory test(s) ordered today    Assessment/Plan   Chronic Rhinitis Seasonal and Perennial Allergic: with possible CRS and ETD Persistent hearing loss since September, with history of allergies and sinus infections. Physical exam shows fluid in the ears and red posterior throat. Patient has difficulty equalizing ears, especially during flights.   - Prevention:  -  allergen avoidance when possible - consider allergy shots as long term control of your symptoms by teaching your immune system to be more tolerant of your allergy triggers  - Symptom control: - Continue Nasal Steroid Spray: Best  results if used daily. - Options include Flonase (fluticasone), Nasocort (triamcinolone), Nasonex (mometasome) 1- 2 sprays in each nostril daily.  - All can be purchased over-the-counter if not covered by insurance. - Continue Astelin (azelastine) 1-2 sprays in each nostril twice a day as needed for nasal congestion/itchy nose - Continue Atrovent (Ipratropium Bromide) 1-2 sprays in each nostril up to 3 times a day as needed for runny nose/post nasal drip/drainage.   - Use less frequently if airway gets too dry. - Continue Singulair (Montelukast) 10mg  nightly.   - Discontinue if nightmares of behavior changes. - Continue Antihistamine: daily or daily as needed.   -Options include Zyrtec (Cetirizine) 10mg , Claritin (Loratadine) 10mg , Allegra (Fexofenadine) 180mg , or Xyzal (Levocetirinze) 5mg  - Can be purchased over-the-counter if not covered by insurance. START: afrin 1 spray per nostril in the AM. Do not use more than this  START: eustachi device, sample given   We will await sinus CT results to see about treatment with antibiotics and steroids    If above doesn't work, consider second option for ENT.   Follow up:  we will call you with SINUS CT results and next step   Other: samples provided of: Eustachi  Thank you so much for letting me partake in your care today.  Don't hesitate to reach out if you have any additional concerns!  Ferol Luz, MD  [facility] [place], [place]

## 2023-05-20 NOTE — Patient Instructions (Signed)
 Chronic Rhinitis Seasonal and Perennial Allergic: with possible CRS and ETD Persistent hearing loss since September, with history of allergies and sinus infections. Physical exam shows fluid in the ears and red posterior throat. Patient has difficulty equalizing ears, especially during flights.   - Prevention:  - allergen avoidance when possible - consider allergy shots as long term control of your symptoms by teaching your immune system to be more tolerant of your allergy triggers  - Symptom control: - Continue Nasal Steroid Spray: Best results if used daily. - Options include Flonase (fluticasone), Nasocort (triamcinolone), Nasonex (mometasome) 1- 2 sprays in each nostril daily.  - All can be purchased over-the-counter if not covered by insurance. - Continue Astelin (azelastine) 1-2 sprays in each nostril twice a day as needed for nasal congestion/itchy nose - Continue Atrovent (Ipratropium Bromide) 1-2 sprays in each nostril up to 3 times a day as needed for runny nose/post nasal drip/drainage.   - Use less frequently if airway gets too dry. - Continue Singulair (Montelukast) 10mg  nightly.   - Discontinue if nightmares of behavior changes. - Continue Antihistamine: daily or daily as needed.   -Options include Zyrtec (Cetirizine) 10mg , Claritin (Loratadine) 10mg , Allegra (Fexofenadine) 180mg , or Xyzal (Levocetirinze) 5mg  - Can be purchased over-the-counter if not covered by insurance. START: afrin 1 spray per nostril in the AM. Do not use more than this  START: eustachi device, sample given   We will await sinus CT results to see about treatment with antibiotics and steroids    If above doesn't work, consider second option for ENT.   Follow up:  we will call you with SINUS CT results and next step   Thank you so much for letting me partake in your care today.  Don't hesitate to reach out if you have any additional concerns!  Ferol Luz, MD  [facility] [place], [place]

## 2023-05-31 ENCOUNTER — Encounter: Payer: Self-pay | Admitting: Neurology

## 2023-05-31 NOTE — Progress Notes (Deleted)
 Assessment/Plan:   *** -He has an appointment with [facility] in [facility] on March 7  Subjective:   Frank Larson was seen today in the movement disorders clinic for neurologic consultation at the request of Emilio Aspen, *.  The consultation is for the evaluation of ***.  Tremor: {yes no:314532}   How long has it been going on? ***  At rest or with activation?  ***  When is it noted the most?  ***  Fam hx of tremor?  {yes BJ:478295}  Located where?  ***  Affected by caffeine:  {yes no:314532}  Affected by alcohol:  {yes no:314532}  Affected by stress:  {yes no:314532}  Affected by fatigue:  {yes no:314532}  Spills soup if on spoon:  {yes no:314532}  Spills glass of liquid if full:  {yes no:314532}  Affects ADL's (tying shoes, brushing teeth, etc):  {yes no:314532}  Tremor inducing meds:  {yes no:314532}  Other Specific Symptoms:  Voice: *** Sleep: ***  Vivid Dreams:  {yes no:314532}  Acting out dreams:  {yes no:314532} Wet Pillows: {yes no:314532} Postural symptoms:  {yes no:314532}  Falls?  {yes no:314532} Bradykinesia symptoms: {parkinson brady:18041} Loss of smell:  {yes no:314532} Loss of taste:  {yes no:314532} Urinary Incontinence:  {yes no:314532} Difficulty Swallowing:  {yes no:314532} Handwriting, micrographia: {yes no:314532} Trouble with ADL's:  {yes no:314532}  Trouble buttoning clothing: {yes no:314532} Depression:  {yes no:314532} Memory changes:  {yes no:314532} Hallucinations:  {yes no:314532}  visual distortions: {yes no:314532} N/V:  {yes no:314532} Lightheaded:  {yes no:314532}  Syncope: {yes no:314532} Diplopia:  {yes no:314532} Dyskinesia:  {yes no:314532}  Neuroimaging of the brain has *** previously been performed.  It *** available for my review today.  PREVIOUS MEDICATIONS: {Parkinson's RX:18200}  ALLERGIES:  No Known Allergies  CURRENT MEDICATIONS:  Current Outpatient Medications  Medication Instructions    albuterol (VENTOLIN HFA) 108 (90 Base) MCG/ACT inhaler INHALE 1-2 PUFFS BY MOUTH AS NEEDED BEFORE EXERCISE   amLODipine (NORVASC) 2.5 MG tablet take 1 tablet by mouth Once a day   amLODipine (NORVASC) 5 mg, Daily   atorvastatin (LIPITOR) 20 mg, Daily   azelastine (ASTELIN) 0.1 % nasal spray 2 sprays, Each Nare, 2 times daily, Use in each nostril as directed   Cholecalciferol (VITAMIN D3) 50 MCG (2000 UT) capsule Take by mouth.   cyanocobalamin (,VITAMIN B-12,) 1000 MCG/ML injection INJECT 1 ML ONCE MONTHLY   cyclobenzaprine (FLEXERIL) 5 MG tablet Take 1-2 in the evening for muscle spasms   diphenoxylate-atropine (LOMOTIL) 2.5-0.025 MG tablet TAKE 1 TABLET BY MOUTH FOUR TIMES DAILY AS NEEDED FOR DIARRHEA OR LOOSE STOOLS   fluticasone (FLONASE) 50 MCG/ACT nasal spray 1 spray, Each Nare, 2 times daily   gabapentin (NEURONTIN) 300 MG capsule Take 2 capsules (600 mg) three times a day.   ipratropium (ATROVENT) 0.06 % nasal spray 2 sprays, Each Nare, 4 times daily   montelukast (SINGULAIR) 10 mg, Oral, Daily at bedtime   valsartan (DIOVAN) 80 mg, Daily   zolpidem (AMBIEN) 10 MG tablet TAKE 1 TABLET BY MOUTH AT BEDTIME IF NEEDED   Zoster Vaccine Adjuvanted [facility]) injection Inject into the muscle to be administered  now and 2nd dose 2-6 months later    Objective:   PHYSICAL EXAMINATION:    VITALS:  There were no vitals filed for this visit.  GEN:  The patient appears stated age and is in NAD. HEENT:  Normocephalic, atraumatic.  The mucous membranes are moist. The superficial temporal arteries are without  ropiness or tenderness. CV:  RRR Lungs:  CTAB Neck/HEME:  There are no carotid bruits bilaterally.  Neurological examination:  Orientation: The patient is alert and oriented x3.  Cranial nerves: There is good facial symmetry.  Extraocular muscles are intact. The visual fields are full to confrontational testing. The speech is fluent and clear. Soft palate rises symmetrically and there is  no tongue deviation. Hearing is intact to conversational tone. Sensation: Sensation is intact to light touch throughout (facial, trunk, extremities). Vibration is intact at the bilateral big toe. There is no extinction with double simultaneous stimulation.  Motor: Strength is 5/5 in the bilateral upper and lower extremities.   Shoulder shrug is equal and symmetric.  There is no pronator drift. Deep tendon reflexes: Deep tendon reflexes are 2/4 at the bilateral biceps, triceps, brachioradialis, patella and achilles. Plantar responses are downgoing bilaterally.  Movement examination: Tone: There is ***tone in the bilateral upper extremities.  The tone in the lower extremities is ***.  Abnormal movements: *** Coordination:  There is *** decremation with RAM's, *** Gait and Station: The patient has *** difficulty arising out of a deep-seated chair without the use of the hands. The patient's stride length is ***.  The patient has a *** pull test.     I have reviewed and interpreted the following labs independently   Chemistry      Component Value Date/Time   NA 143 08/21/2019 0923   K 4.2 08/21/2019 0923   CL 105 08/21/2019 0923   CO2 24 08/21/2019 0923   BUN 15 08/21/2019 0923   CREATININE 0.96 08/21/2019 0923      Component Value Date/Time   CALCIUM 9.4 08/21/2019 0923   ALKPHOS 49 05/11/2019 1628   AST 18 05/11/2019 1628   ALT 14 05/11/2019 1628   BILITOT 1.2 05/11/2019 1628      Lab Results  Component Value Date   TSH 1.37 01/08/2016   Lab Results  Component Value Date   WBC 5.2 05/11/2019   HGB 13.8 05/11/2019   HCT 39.9 05/11/2019   MCV 90.3 05/11/2019   PLT 217.0 05/11/2019      Total time spent on today's visit was ***greater than 60 minutes, including both face-to-face time and nonface-to-face time.  Time included that spent on review of records (prior notes available to me/labs/imaging if pertinent), discussing treatment and goals, answering patient's questions and  coordinating care.  Cc:  Emilio Aspen, MD

## 2023-05-31 NOTE — Progress Notes (Signed)
 There is mild mucosal thickening throughout the sinuses.  I would recommend treatment for chronic sinusitis with steroid and 3 weeks of antibiotics. If patient is agreeable I will send in the medications.  Can someone let patient know?

## 2023-06-01 ENCOUNTER — Ambulatory Visit: Admitting: Neurology

## 2023-06-11 ENCOUNTER — Other Ambulatory Visit: Payer: Self-pay | Admitting: Sports Medicine

## 2023-06-13 ENCOUNTER — Ambulatory Visit: Admitting: Neurology

## 2023-06-14 DIAGNOSIS — Z820 Family history of epilepsy and other diseases of the nervous system: Secondary | ICD-10-CM | POA: Diagnosis not present

## 2023-06-14 DIAGNOSIS — R251 Tremor, unspecified: Secondary | ICD-10-CM | POA: Diagnosis not present

## 2023-06-14 DIAGNOSIS — M79643 Pain in unspecified hand: Secondary | ICD-10-CM | POA: Diagnosis not present

## 2023-06-14 DIAGNOSIS — I10 Essential (primary) hypertension: Secondary | ICD-10-CM | POA: Diagnosis not present

## 2023-06-15 ENCOUNTER — Telehealth: Payer: Self-pay | Admitting: *Deleted

## 2023-06-15 MED ORDER — PREDNISONE 10 MG PO TABS: ORAL_TABLET | ORAL | 0 refills | Status: AC

## 2023-06-15 MED ORDER — AMOXICILLIN-POT CLAVULANATE 875-125 MG PO TABS: 1.0000 | ORAL_TABLET | Freq: Two times a day (BID) | ORAL | 0 refills | Status: AC

## 2023-06-15 NOTE — Telephone Encounter (Signed)
 Patient called and stated that he never received antibiotics or prednisone after being reviewed the results for his sinus CT. He would like the medications to be sent to the [facility] on [facility].

## 2023-06-15 NOTE — Telephone Encounter (Signed)
 Per Dr. Marlynn Perking send in prednisone 40mg  daily for 5 days, then prednisone 20mg  daily for 5 days.  At the same time start augmentin 875/125mg  twice daily for 21 days . Medications have been sent in to the pharmacy. I called the patient and informed, patient verbalized understanding.

## 2023-06-15 NOTE — Addendum Note (Signed)
 Addended by: Dollene Cleveland R on: 06/15/2023 03:07 PM   Modules accepted: Orders

## 2023-06-17 DIAGNOSIS — M79643 Pain in unspecified hand: Secondary | ICD-10-CM | POA: Diagnosis not present

## 2023-07-12 DIAGNOSIS — K080 Exfoliation of teeth due to systemic causes: Secondary | ICD-10-CM | POA: Diagnosis not present

## 2023-07-22 ENCOUNTER — Other Ambulatory Visit: Payer: Self-pay | Admitting: Sports Medicine

## 2023-07-25 ENCOUNTER — Other Ambulatory Visit: Payer: Self-pay

## 2023-07-25 MED ORDER — GABAPENTIN 300 MG PO CAPS: ORAL_CAPSULE | ORAL | 2 refills | Status: DC

## 2023-07-25 NOTE — Progress Notes (Signed)
 Pt called requesting refill on Gabapentin.  Rx sent to pharmacy.

## 2023-09-13 ENCOUNTER — Ambulatory Visit: Admitting: Sports Medicine

## 2023-09-13 VITALS — BP 128/59 | Ht 73.0 in | Wt 154.0 lb

## 2023-09-13 DIAGNOSIS — M19031 Primary osteoarthritis, right wrist: Secondary | ICD-10-CM | POA: Diagnosis not present

## 2023-09-13 DIAGNOSIS — M5386 Other specified dorsopathies, lumbar region: Secondary | ICD-10-CM | POA: Diagnosis not present

## 2023-09-13 DIAGNOSIS — M542 Cervicalgia: Secondary | ICD-10-CM | POA: Diagnosis not present

## 2023-09-13 DIAGNOSIS — M19039 Primary osteoarthritis, unspecified wrist: Secondary | ICD-10-CM | POA: Insufficient documentation

## 2023-09-13 DIAGNOSIS — M19032 Primary osteoarthritis, left wrist: Secondary | ICD-10-CM | POA: Diagnosis not present

## 2023-09-13 MED ORDER — GABAPENTIN 600 MG PO TABS: 600.0000 mg | ORAL_TABLET | Freq: Three times a day (TID) | ORAL | 3 refills | Status: DC

## 2023-09-13 NOTE — Assessment & Plan Note (Signed)
 We will continue to use the wrist wraps for activity as they have helped I strongly encouraged him to try using more topical medicine including topical Voltaren  to see if he can get some relief  He will try these things and check with me as needed

## 2023-09-13 NOTE — Assessment & Plan Note (Signed)
 His lumbar spine is relatively stable and we have controlled most of his sciatic symptoms with the use of gabapentin   He will continue on a good exercise program which he developed with Earney Going, PT  He will continue his gabapentin 

## 2023-09-13 NOTE — Assessment & Plan Note (Signed)
 I suggested he begin a series of isometric exercises for his neck pain.  It is pretty likely that he has degenerative changes here but they are not severe enough to warrant more workup at this time

## 2023-09-13 NOTE — Progress Notes (Signed)
 Patient comes in for follow-up of spine and wrist issues  His lumbar spine has been holding pretty steady with some pain that is lessened by his home exercise program.  He has modified his swelling so he does very little flutter kick in so that no longer bothers his back.  We did discuss easing back into some increased kicking in a swimming if his back does well.  He continues to get numbness that radiates down into the right foot. He remains on gabapentin  600 3 times daily He did miss the medication for about 3 days and got significantly worse Once he started back his symptoms were much better controlled  Neck issues-he gets some pain rotating his neck to the left that is high on the posterior neck.  Of note when he swims he breathes almost exclusively on the left side.  He does not know of other injuries.  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  Currently he is not doing anything for this other than the wrist wraps we gave him which have helped.  Physical exam Thin white male in no acute distress BP (!) 128/59 (BP Location: Left Arm, Patient Position: Sitting, Cuff Size: Normal)   Ht 6' 1 (1.854 m)   Wt 154 lb (69.9 kg)   BMI 20.32 kg/m   Examination of the neck reveals slight limitation of extension Good rotation and lateral bending to the right Rotation to the left is mildly limited and reproduces some of the pain along the upper trapezius and muscles of the neck.  Lateral bend is OK.  No neurologic symptoms or irritation on examination of his neck or back today  Wrists show full flexion and extension He has squaring of his CMC joints bilaterally He has spurring of the joints across his proximal carpal row

## 2023-10-14 DIAGNOSIS — I10 Essential (primary) hypertension: Secondary | ICD-10-CM | POA: Diagnosis not present

## 2023-10-14 DIAGNOSIS — Z79899 Other long term (current) drug therapy: Secondary | ICD-10-CM | POA: Diagnosis not present

## 2023-10-14 DIAGNOSIS — Z1331 Encounter for screening for depression: Secondary | ICD-10-CM | POA: Diagnosis not present

## 2023-10-14 DIAGNOSIS — M79643 Pain in unspecified hand: Secondary | ICD-10-CM | POA: Diagnosis not present

## 2023-10-14 DIAGNOSIS — E559 Vitamin D deficiency, unspecified: Secondary | ICD-10-CM | POA: Diagnosis not present

## 2023-10-14 DIAGNOSIS — E538 Deficiency of other specified B group vitamins: Secondary | ICD-10-CM | POA: Diagnosis not present

## 2023-10-14 DIAGNOSIS — R251 Tremor, unspecified: Secondary | ICD-10-CM | POA: Diagnosis not present

## 2023-10-14 DIAGNOSIS — Z125 Encounter for screening for malignant neoplasm of prostate: Secondary | ICD-10-CM | POA: Diagnosis not present

## 2023-10-14 DIAGNOSIS — Z0000 Encounter for general adult medical examination without abnormal findings: Secondary | ICD-10-CM | POA: Diagnosis not present

## 2023-10-14 DIAGNOSIS — E785 Hyperlipidemia, unspecified: Secondary | ICD-10-CM | POA: Diagnosis not present

## 2023-10-26 ENCOUNTER — Ambulatory Visit (INDEPENDENT_AMBULATORY_CARE_PROVIDER_SITE_OTHER): Admitting: Otolaryngology

## 2023-10-26 ENCOUNTER — Encounter (INDEPENDENT_AMBULATORY_CARE_PROVIDER_SITE_OTHER): Payer: Self-pay | Admitting: Otolaryngology

## 2023-10-26 VITALS — BP 138/74 | HR 58

## 2023-10-26 DIAGNOSIS — H6980 Other specified disorders of Eustachian tube, unspecified ear: Secondary | ICD-10-CM

## 2023-10-26 DIAGNOSIS — H903 Sensorineural hearing loss, bilateral: Secondary | ICD-10-CM | POA: Diagnosis not present

## 2023-10-26 DIAGNOSIS — H6983 Other specified disorders of Eustachian tube, bilateral: Secondary | ICD-10-CM

## 2023-10-26 DIAGNOSIS — H9042 Sensorineural hearing loss, unilateral, left ear, with unrestricted hearing on the contralateral side: Secondary | ICD-10-CM | POA: Insufficient documentation

## 2023-10-26 NOTE — Progress Notes (Signed)
 Patient ID: Frank Larson, male   DOB: [birth year], 73 y.o.   MRN: [id number]  CC: Left ear hearing loss  HPI: The patient is a 73 year old male who presents today complaining of progressive left ear hearing loss.  The patient was last seen in October 2024.  At that time, she was noted to have bilateral sensorineural hearing loss and bilateral eustachian tube dysfunction.  He was treated with Flonase  nasal spray and Valsalva exercise.  The patient returns today complaining of progressive hearing difficulty on the left side.  He was recently evaluated at [facility] audiology clinic.  He was told he has asymmetric left ear hearing loss.  He was therefore referred back for evaluation of retrocochlear lesion.  He is still having congestive sensation in his nose and his left ear.  He denies any significant otalgia, otorrhea, or vertigo.  Exam: General: Communicates without difficulty, well nourished, no acute distress. Head: Normocephalic, no evidence injury, no tenderness, facial buttresses intact without stepoff. Face/sinus: No tenderness to palpation and percussion. Facial movement is normal and symmetric. Eyes: PERRL, EOMI. No scleral icterus, conjunctivae clear. Neuro: CN II exam reveals vision grossly intact.  No nystagmus at any point of gaze. Ears: Auricles well formed without lesions.  Ear canals are intact without mass or lesion.  No erythema or edema is appreciated.  The TMs are intact without fluid. Nose: External evaluation reveals normal support and skin without lesions.  Dorsum is intact.  Anterior rhinoscopy reveals congested mucosa over anterior aspect of inferior turbinates and intact septum.  No purulence noted. Oral:  Oral cavity and oropharynx are intact, symmetric, without erythema or edema.  Mucosa is moist without lesions. Neck: Full range of motion without pain.  There is no significant lymphadenopathy.  No masses palpable.  Thyroid  bed within normal limits to palpation.  Parotid glands and  submandibular glands equal bilaterally without mass.  Trachea is midline. Neuro:  CN 2-12 grossly intact.   Assessment: 1.  The patient's ear canals, tympanic membranes, and middle ear spaces are noted to be normal.  No middle ear effusion is noted today. 2.  Bilateral sensorineural hearing loss, worse on the left side. 3.  Clinical eustachian tube dysfunction.  Plan: 1.  The physical exam findings are reviewed with the patient. 2.  Continue with Flonase  nasal spray and Valsalva exercise. 3.  In light of his asymmetric hearing loss, we will obtain an MRI scan to evaluate for retrocochlear lesion. 4.  Repeat outpatient audiometric evaluation.  No audiologist is available today. 5.  The patient will return for reevaluation after his MRI scan.

## 2023-11-21 ENCOUNTER — Telehealth (INDEPENDENT_AMBULATORY_CARE_PROVIDER_SITE_OTHER): Payer: Self-pay | Admitting: Otolaryngology

## 2023-11-21 NOTE — Telephone Encounter (Signed)
 Pt called and stated got hearing test elsewhere insurance covered it Dr.Teoh wanted 2nd hearing test and insurance will not cover second hearing test unless prior auth given please advise

## 2023-11-24 ENCOUNTER — Ambulatory Visit (HOSPITAL_COMMUNITY)
Admission: RE | Admit: 2023-11-24 | Discharge: 2023-11-24 | Disposition: A | Discharge status: Home / Self Care | Source: Ambulatory Visit | Attending: Otolaryngology | Admitting: Otolaryngology

## 2023-11-24 DIAGNOSIS — H905 Unspecified sensorineural hearing loss: Secondary | ICD-10-CM | POA: Diagnosis not present

## 2023-11-24 DIAGNOSIS — H9042 Sensorineural hearing loss, unilateral, left ear, with unrestricted hearing on the contralateral side: Secondary | ICD-10-CM | POA: Diagnosis not present

## 2023-11-24 MED ORDER — GADOBUTROL 1 MMOL/ML IV SOLN
7.0000 mL | Freq: Once | INTRAVENOUS | Status: AC | PRN
  Administered 2023-11-24: 7 mL via INTRAVENOUS

## 2023-12-06 DIAGNOSIS — Z820 Family history of epilepsy and other diseases of the nervous system: Secondary | ICD-10-CM | POA: Diagnosis not present

## 2023-12-06 DIAGNOSIS — Z0489 Encounter for examination and observation for other specified reasons: Secondary | ICD-10-CM | POA: Diagnosis not present

## 2023-12-07 DIAGNOSIS — H40023 Open angle with borderline findings, high risk, bilateral: Secondary | ICD-10-CM | POA: Diagnosis not present

## 2023-12-07 DIAGNOSIS — H31091 Other chorioretinal scars, right eye: Secondary | ICD-10-CM | POA: Diagnosis not present

## 2023-12-07 DIAGNOSIS — H2513 Age-related nuclear cataract, bilateral: Secondary | ICD-10-CM | POA: Diagnosis not present

## 2023-12-07 DIAGNOSIS — H43813 Vitreous degeneration, bilateral: Secondary | ICD-10-CM | POA: Diagnosis not present

## 2023-12-15 DIAGNOSIS — A0472 Enterocolitis due to Clostridium difficile, not specified as recurrent: Secondary | ICD-10-CM | POA: Diagnosis not present

## 2023-12-15 DIAGNOSIS — K52832 Lymphocytic colitis: Secondary | ICD-10-CM | POA: Diagnosis not present

## 2024-02-02 DIAGNOSIS — M25531 Pain in right wrist: Secondary | ICD-10-CM | POA: Diagnosis not present

## 2024-02-02 DIAGNOSIS — M25532 Pain in left wrist: Secondary | ICD-10-CM | POA: Diagnosis not present

## 2024-04-03 VITALS — BP 122/78 | Ht 72.5 in | Wt 148.0 lb

## 2024-04-03 DIAGNOSIS — M5386 Other specified dorsopathies, lumbar region: Secondary | ICD-10-CM

## 2024-04-03 MED ORDER — GABAPENTIN 600 MG PO TABS: 1200.0000 mg | ORAL_TABLET | Freq: Every day | ORAL | 3 refills | Status: DC

## 2024-04-03 NOTE — Progress Notes (Signed)
 " PCP: Charlott Dorn LABOR, MD  Subjective:   HPI: Patient is a 74 y.o. male here for follow-up of lumbar pain with radicular symptoms.  Patient was seen nearly 6 months ago and has been on gabapentin  600 mg 3 times daily for lumbar pain with radicular symptoms.  He had an MRI completed in 2024 which did show L5-S1 right neuroforaminal narrowing and circumferential disc bulge at this level.  Previously he had been managed well conservatively with home exercises, stretching, activity modification and gabapentin .  He only takes naproxen if his symptoms become severe.  Patient states over the last 2 months he feels like things have slightly been worse.  He still notes pain in his right lumbar back.  And notes that his sensation of tingling and numbness seems to be present more often.  He describes a tingling and numbness only present on the bottom of his right foot.  Predominantly this started on the outside of the foot but has progressed to move somewhat medially and include the entire bottom side of the foot.  He still notes that the gabapentin  is very helpful and denies any drowsiness or side effects that he can tell from his current dose.  Past Medical History:  Diagnosis Date   Allergy    Arthritis    Basal cell carcinoma 04/28/2016   left cheek tx cx3 and exc   Colitis    Diverticulosis    Eczema    Hyperlipidemia    off medicines currently    Metatarsal fracture    right    Medications Ordered Prior to Encounter[1]  BP 122/78   Ht 6' 0.5 (1.842 m)   Wt 148 lb (67.1 kg)   BMI 19.80 kg/m        Objective:   Physical Exam:  Gen: NAD, comfortable in exam room Lumbar spine Inspection: No erythema, edema or warmth Palpation: Mild tenderness palpation in the lumbar paraspinal region on the right Special Tests: Negative straight leg testing Neuro: hip flexion, knee extension, plantarflexion and dorsiflexion strength are 5/5, sensation is intact and equal other than reported  slight sensation change on the entire side of his right foot.  Assessment/Plan:   Frank Larson is a 74 y.o. male who was seen today for the following: 1. 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  -Discussed multiple options with patient at this time - Discussed neurosurgery referral versus increasing gabapentin  as he has had minimal side effects - Gabapentin  has previously been very successful in reducing symptoms - He was in agreement to increase gabapentin  at night to 1200 mg - He will then continue to take 600 mg twice a day - Instructed him about activity modification with yard work, and other activities - We will follow-up with him in a couple months to see how he is doing - We did discuss his MRI results and where he has his narrowing of his spine - He may need referral in the future but at this point we are pursuing conservative management - gabapentin  (NEURONTIN ) 600 MG tablet; Take 2 tablets (1,200 mg total) by mouth at bedtime.  Dispense: 60 tablet; Refill: 3  Follow-up/Education:   No follow-ups on file.   May return sooner as needed and encouraged to call/e-mail for additional questions or  worsening symptoms in the interim.  Krystal Lowing, DO Sports Medicine Fellow 04/03/2024 12:58 PM    [1]  Current Outpatient Medications on File Prior to Visit  Medication  Sig Dispense Refill   albuterol  (VENTOLIN  HFA) 108 (90 Base) MCG/ACT inhaler INHALE 1-2 PUFFS BY MOUTH AS NEEDED BEFORE EXERCISE 36 g 2   amLODipine  (NORVASC ) 2.5 MG tablet take 1 tablet by mouth Once a day 90 tablet 3   amLODipine  (NORVASC ) 5 MG tablet Take 5 mg by mouth daily.     atorvastatin  (LIPITOR) 20 MG tablet Take 20 mg by mouth daily.     azelastine  (ASTELIN ) 0.1 % nasal spray Place 2 sprays into both nostrils 2 (two) times daily. Use in each nostril as directed 30 mL 12   Cholecalciferol (VITAMIN D3) 50 MCG (2000 UT) capsule Take by mouth.     cyanocobalamin   (,VITAMIN B-12,) 1000 MCG/ML injection INJECT 1 ML ONCE MONTHLY 6 mL 5   cyclobenzaprine  (FLEXERIL ) 5 MG tablet Take 1-2 in the evening for muscle spasms 30 tablet 0   diphenoxylate -atropine  (LOMOTIL ) 2.5-0.025 MG tablet TAKE 1 TABLET BY MOUTH FOUR TIMES DAILY AS NEEDED FOR DIARRHEA OR LOOSE STOOLS 360 tablet 1   fluticasone  (FLONASE ) 50 MCG/ACT nasal spray Place 1 spray into both nostrils 2 (two) times daily. 16 g 2   gabapentin  (NEURONTIN ) 600 MG tablet Take 1 tablet (600 mg total) by mouth 3 (three) times daily. 270 tablet 3   ipratropium (ATROVENT ) 0.06 % nasal spray Place 2 sprays into both nostrils 4 (four) times daily. 15 mL 12   montelukast  (SINGULAIR ) 10 MG tablet Take 1 tablet (10 mg total) by mouth at bedtime. 90 tablet 1   predniSONE  (DELTASONE ) 10 MG tablet Take 40 mg daily for 5 days, then 20 mg daily for 5 days. 30 tablet 0   valsartan  (DIOVAN ) 80 MG tablet Take 80 mg by mouth daily.     zolpidem  (AMBIEN ) 10 MG tablet TAKE 1 TABLET BY MOUTH AT BEDTIME IF NEEDED 30 tablet 2   Zoster Vaccine Adjuvanted (SHINGRIX ) injection Inject into the muscle to be administered  now and 2nd dose 2-6 months later 1 each 1   No current facility-administered medications on file prior to visit.   "

## 2024-04-03 NOTE — Patient Instructions (Signed)
 Take 1200 mg at night  600mg  twice a day

## 2024-04-04 ENCOUNTER — Other Ambulatory Visit: Payer: Self-pay

## 2024-04-04 DIAGNOSIS — M5386 Other specified dorsopathies, lumbar region: Secondary | ICD-10-CM

## 2024-04-04 MED ORDER — GABAPENTIN 600 MG PO TABS: 600.0000 mg | ORAL_TABLET | Freq: Three times a day (TID) | ORAL | 0 refills | Status: AC

## 2024-04-04 NOTE — Progress Notes (Signed)
 Pt called stating the Rx that was sent yesterday needs to be for 5 tablets daily.  New Rx sent with instructions to take 1 tablet 3 times daily and 2 tablets at bedtime as discussed at his office visit.
